# Patient Record
Sex: Female | Born: 1990 | Race: Black or African American | Hispanic: No | Marital: Single | Smoking: Never smoker
Health system: Southern US, Community
[De-identification: ages and names within clinical notes are randomized; demographics above are authoritative.]

## PROBLEM LIST (undated history)

## (undated) DIAGNOSIS — N760 Acute vaginitis: Secondary | ICD-10-CM

## (undated) DIAGNOSIS — E669 Obesity, unspecified: Secondary | ICD-10-CM

## (undated) HISTORY — DX: Obesity, unspecified: E66.9

## (undated) HISTORY — DX: Acute vaginitis: N76.0

## (undated) HISTORY — PX: NO PAST SURGERIES: SHX2092

---
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
## 2012-12-19 ENCOUNTER — Emergency Department (HOSPITAL_COMMUNITY): Payer: BC Managed Care – PPO

## 2012-12-19 ENCOUNTER — Encounter (HOSPITAL_COMMUNITY): Payer: Self-pay | Admitting: Emergency Medicine

## 2012-12-19 ENCOUNTER — Emergency Department (HOSPITAL_COMMUNITY)
Admission: EM | Admit: 2012-12-19 | Discharge: 2012-12-19 | Disposition: A | Discharge status: Home / Self Care | Payer: BC Managed Care – PPO | Attending: Emergency Medicine | Admitting: Emergency Medicine

## 2012-12-19 DIAGNOSIS — IMO0002 Reserved for concepts with insufficient information to code with codable children: Secondary | ICD-10-CM | POA: Insufficient documentation

## 2012-12-19 DIAGNOSIS — S43401A Unspecified sprain of right shoulder joint, initial encounter: Secondary | ICD-10-CM

## 2012-12-19 DIAGNOSIS — Y939 Activity, unspecified: Secondary | ICD-10-CM | POA: Insufficient documentation

## 2012-12-19 DIAGNOSIS — Y929 Unspecified place or not applicable: Secondary | ICD-10-CM | POA: Insufficient documentation

## 2012-12-19 DIAGNOSIS — X58XXXA Exposure to other specified factors, initial encounter: Secondary | ICD-10-CM | POA: Insufficient documentation

## 2012-12-19 MED ORDER — IBUPROFEN 600 MG PO TABS: 600.0000 mg | ORAL_TABLET | Freq: Four times a day (QID) | ORAL | Status: DC | PRN

## 2012-12-19 MED ORDER — CYCLOBENZAPRINE HCL 5 MG PO TABS: 5.0000 mg | ORAL_TABLET | Freq: Two times a day (BID) | ORAL | Status: DC | PRN

## 2012-12-19 NOTE — ED Notes (Signed)
Pt complains of sudden onset of right posterior shoulder pain.

## 2012-12-19 NOTE — ED Notes (Signed)
[id number]<WG> Expected date:<BR> Expected time:<BR> Means of arrival:<BR> Comments:<BR> ems-shoulder/back pain

## 2012-12-19 NOTE — ED Provider Notes (Signed)
History     CSN: [id number]  Arrival date & time 12/19/12  1010   First MD Initiated Contact with Patient 12/19/12 1112      Chief Complaint  Patient presents with  . Shoulder Pain    (Consider location/radiation/quality/duration/timing/severity/associated sxs/prior treatment) HPI  She presents to the emergency department with complaints of right shoulder pain that she woke up with this morning. She normally sleeps on her stomach. She does not remember any incidents where she injured it. She has no history of injury here in the past. She has no numbness tingling or weakness in the arm. No chest pain, shortness of breath, cough. nas vss  History reviewed. No pertinent past medical history.  History reviewed. No pertinent past surgical history.  No family history on file.  History  Substance Use Topics  . Smoking status: Never Smoker   . Smokeless tobacco: Not on file  . Alcohol Use: No    OB History   Grav Para Term Preterm Abortions TAB SAB Ect Mult Living                  Review of Systems  All other systems reviewed and are negative.    Allergies  Review of patient's allergies indicates no known allergies.  Home Medications   Current Outpatient Rx  Name  Route  Sig  Dispense  Refill  . cyclobenzaprine (FLEXERIL) 5 MG tablet   Oral   Take 1 tablet (5 mg total) by mouth 2 (two) times daily as needed for muscle spasms.   12 tablet   0   . ibuprofen (ADVIL,MOTRIN) 600 MG tablet   Oral   Take 1 tablet (600 mg total) by mouth every 6 (six) hours as needed for pain.   30 tablet   0     BP 125/63  Pulse 71  Temp(Src) 97.9 F (36.6 C) (Oral)  Resp 18  SpO2 99%  LMP 12/08/2012  Physical Exam  Nursing note and vitals reviewed. Constitutional: She appears well-developed and well-nourished. No distress.  HENT:  Head: Normocephalic and atraumatic.  Eyes: Pupils are equal, round, and reactive to light.  Neck: Normal range of motion. Neck supple.    Cardiovascular: Normal rate and regular rhythm.   Pulmonary/Chest: Effort normal.  Abdominal: Soft.  Musculoskeletal:       Thoracic back: She exhibits tenderness, pain and spasm. She exhibits normal range of motion, no bony tenderness, no swelling, no edema, no deformity, no laceration and normal pulse.       Back:  Neurological: She is alert.  Skin: Skin is warm and dry.    ED Course  Procedures (including critical care time)  Labs Reviewed - No data to display Dg Shoulder Right  12/19/2012  *RADIOLOGY REPORT*  Clinical Data: Right shoulder pain without injury  RIGHT SHOULDER - 2+ VIEW  Comparison: None.  Findings: No acute fracture or dislocation is noted.  No soft tissue abnormality is seen.  The underlying bony thorax is within normal limits.  IMPRESSION: No acute abnormality is noted.   Original Report Authenticated By: Alcide Clever, M.D.      1. Shoulder sprain, right, initial encounter       MDM  No abnormal findings on physical exam.  Flexeril and Ibuprofen Rx. Referral to Ortho.  Pt has been advised of the symptoms that warrant their return to the ED. Patient has voiced understanding and has agreed to follow-up with the PCP or specialist.  Dorthula Matas, PA-C 12/19/12 1224

## 2012-12-19 NOTE — ED Notes (Signed)
Patient transported to X-ray 

## 2012-12-19 NOTE — ED Notes (Signed)
Pt ambulated to restroom. 

## 2012-12-21 NOTE — ED Provider Notes (Signed)
Medical screening examination/treatment/procedure(s) were performed by non-physician practitioner and as supervising physician I was immediately available for consultation/collaboration.   Ilias Stcharles E Meagan Spease, MD 12/21/12 2148 

## 2015-01-16 ENCOUNTER — Emergency Department (HOSPITAL_COMMUNITY)
Admission: EM | Admit: 2015-01-16 | Discharge: 2015-01-16 | Disposition: A | Discharge status: Home / Self Care | Payer: Self-pay | Attending: Emergency Medicine | Admitting: Emergency Medicine

## 2015-01-16 ENCOUNTER — Encounter (HOSPITAL_COMMUNITY): Payer: Self-pay | Admitting: Emergency Medicine

## 2015-01-16 DIAGNOSIS — R000 Tachycardia, unspecified: Secondary | ICD-10-CM | POA: Insufficient documentation

## 2015-01-16 DIAGNOSIS — Z79899 Other long term (current) drug therapy: Secondary | ICD-10-CM | POA: Insufficient documentation

## 2015-01-16 DIAGNOSIS — Z791 Long term (current) use of non-steroidal anti-inflammatories (NSAID): Secondary | ICD-10-CM | POA: Insufficient documentation

## 2015-01-16 DIAGNOSIS — A5901 Trichomonal vulvovaginitis: Secondary | ICD-10-CM | POA: Insufficient documentation

## 2015-01-16 DIAGNOSIS — N390 Urinary tract infection, site not specified: Secondary | ICD-10-CM | POA: Insufficient documentation

## 2015-01-16 LAB — URINALYSIS, ROUTINE W REFLEX MICROSCOPIC
Glucose, UA: NEGATIVE mg/dL
KETONES UR: NEGATIVE mg/dL
NITRITE: NEGATIVE
Protein, ur: 30 mg/dL — AB
Specific Gravity, Urine: 1.038 — ABNORMAL HIGH (ref 1.005–1.030)
UROBILINOGEN UA: 1 mg/dL (ref 0.0–1.0)
pH: 5.5 (ref 5.0–8.0)

## 2015-01-16 LAB — URINE MICROSCOPIC-ADD ON

## 2015-01-16 LAB — WET PREP, GENITAL
CLUE CELLS WET PREP: NONE SEEN
Yeast Wet Prep HPF POC: NONE SEEN

## 2015-01-16 MED ORDER — METRONIDAZOLE 500 MG PO TABS: 500.0000 mg | ORAL_TABLET | Freq: Two times a day (BID) | ORAL | Status: DC

## 2015-01-16 MED ORDER — CEFTRIAXONE SODIUM 250 MG IJ SOLR
250.0000 mg | Freq: Once | INTRAMUSCULAR | Status: AC
  Administered 2015-01-16: 250 mg via INTRAMUSCULAR
  Filled 2015-01-16: qty 250

## 2015-01-16 MED ORDER — AZITHROMYCIN 250 MG PO TABS
1000.0000 mg | ORAL_TABLET | Freq: Once | ORAL | Status: AC
  Administered 2015-01-16: 1000 mg via ORAL
  Filled 2015-01-16: qty 4

## 2015-01-16 MED ORDER — CEPHALEXIN 500 MG PO CAPS: 500.0000 mg | ORAL_CAPSULE | Freq: Four times a day (QID) | ORAL | Status: DC

## 2015-01-16 MED ORDER — LIDOCAINE HCL (PF) 1 % IJ SOLN
INTRAMUSCULAR | Status: AC
Start: 2015-01-16 — End: 2015-01-16
  Administered 2015-01-16: 5 mL
  Filled 2015-01-16: qty 5

## 2015-01-16 NOTE — ED Notes (Signed)
Pt is unable to give urine sample at this time. 

## 2015-01-16 NOTE — ED Provider Notes (Signed)
CSN: [id number]     Arrival date & time 01/16/15  1809 History   First MD Initiated Contact with Patient 01/16/15 1855     Chief Complaint  Patient presents with  . Vaginal pain      (Consider location/radiation/quality/duration/timing/severity/associated sxs/prior Treatment) HPI Comments: 24 year old morbidly obese female complaining of itching and burning to her vagina after applying Darene Lamerair 2 days ago to her genital area. States she feels burning both on the inner and outer aspect of her vagina, states it feels very irritated, and when she wiped this morning, she noted small streaks of blood on the toilet paper which she believes may be from irritation. Reports she has not looked at the area as she cannot see her vagina over her stomach. Denies vaginal discharge, increased urinary frequency or urgency. Reports dysuria. States she is sexually active, and last had intercourse about one month ago.  The history is provided by the patient.    History reviewed. No pertinent past medical history. History reviewed. No pertinent past surgical history. History reviewed. No pertinent family history. History  Substance Use Topics  . Smoking status: Never Smoker   . Smokeless tobacco: Not on file  . Alcohol Use: No   OB History    No data available     Review of Systems  Genitourinary: Positive for dysuria and vaginal pain.  All other systems reviewed and are negative.     Allergies  Mushroom extract complex and Fish-derived products  Home Medications   Prior to Admission medications   Medication Sig Start Date End Date Taking? Authorizing Provider  acetaminophen (TYLENOL) 500 MG tablet Take 500 mg by mouth every 6 (six) hours as needed for moderate pain (pain).   Yes Historical Provider, MD  cephALEXin (KEFLEX) 500 MG capsule Take 1 capsule (500 mg total) by mouth 4 (four) times daily. 01/16/15   Kathrynn Speedobyn M Evonne Rinks, PA-C  cyclobenzaprine (FLEXERIL) 5 MG tablet Take 1 tablet (5 mg total) by  mouth 2 (two) times daily as needed for muscle spasms. Patient not taking: Reported on 01/16/2015 12/19/12   Marlon Peliffany Greene, PA-C  ibuprofen (ADVIL,MOTRIN) 600 MG tablet Take 1 tablet (600 mg total) by mouth every 6 (six) hours as needed for pain. Patient not taking: Reported on 01/16/2015 12/19/12   Marlon Peliffany Greene, PA-C  metroNIDAZOLE (FLAGYL) 500 MG tablet Take 1 tablet (500 mg total) by mouth 2 (two) times daily. One po bid x 7 days 01/16/15   Nada Boozerobyn M Maisa Bedingfield, PA-C   BP 137/69 mmHg  Pulse 104  Temp(Src) 98.1 F (36.7 C) (Oral)  Resp 20  SpO2 96% Physical Exam  Constitutional: She is oriented to person, place, and time. She appears well-developed and well-nourished. No distress.  Morbidly obese.  HENT:  Head: Normocephalic and atraumatic.  Mouth/Throat: Oropharynx is clear and moist.  Eyes: Conjunctivae and EOM are normal.  Neck: Normal range of motion. Neck supple.  Cardiovascular: Regular rhythm and normal heart sounds.   Mildly tachycardic.  Pulmonary/Chest: Effort normal and breath sounds normal. No respiratory distress.  Genitourinary: There is no rash on the right labia. There is no rash on the left labia. Cervix exhibits no motion tenderness, no discharge and no friability. No erythema, tenderness or bleeding in the vagina. No foreign body around the vagina. Vaginal discharge (white/clear, malodorous) found.  Exam limited by pt's body habitus.  Musculoskeletal: Normal range of motion. She exhibits no edema.  Neurological: She is alert and oriented to person, place, and time. No sensory deficit.  Skin: Skin is warm and dry.  Psychiatric: She has a normal mood and affect. Her behavior is normal.  Nursing note and vitals reviewed.   ED Course  Procedures (including critical care time) Labs Review Labs Reviewed  WET PREP, GENITAL - Abnormal; Notable for the following:    Trich, Wet Prep FEW (*)    WBC, Wet Prep HPF POC FEW (*)    All other components within normal limits  URINALYSIS,  ROUTINE W REFLEX MICROSCOPIC - Abnormal; Notable for the following:    Color, Urine AMBER (*)    APPearance CLOUDY (*)    Specific Gravity, Urine 1.038 (*)    Hgb urine dipstick SMALL (*)    Bilirubin Urine SMALL (*)    Protein, ur 30 (*)    Leukocytes, UA MODERATE (*)    All other components within normal limits  URINE MICROSCOPIC-ADD ON - Abnormal; Notable for the following:    Squamous Epithelial / LPF MANY (*)    All other components within normal limits  GC/CHLAMYDIA PROBE AMP ([place])    Imaging Review No results found.   EKG Interpretation None      MDM   Final diagnoses:  Trichomonas vaginitis  UTI (lower urinary tract infection)   Nontoxic appearing, NAD. Afebrile. Mild tachycardia.vitals otherwise stable. Abdomen soft and non tender. Wet prep significant for Trichomonas. UA contaminated, however still has 21-50 white blood cells, moderate leukocytes. Given dysuria, will treat for UTI. There is no rash around her vaginal area. Infection care/precautions discussed. IM Rocephin given. PO azithromycin given. No CMT or adnexal tenderness. Discharge home with Keflex and Flagyl. Stable for discharge. Return precautions given. Patient states understanding of treatment care plan and is agreeable.  Kathrynn SpeedRobyn M Maan Zarcone, PA-C 01/16/15 2131  Mancel BaleElliott Wentz, MD 01/17/15 [phone]

## 2015-01-16 NOTE — ED Notes (Signed)
Med Hold complete discharge at 2202

## 2015-01-16 NOTE — Discharge Instructions (Signed)
Take Flagyl twice daily for 1 week. Take Keflex as prescribed for 1 week. You were treated today for sexually transmitted diseases. You are obligated to inform your partner for treatment.  Trichomoniasis Trichomoniasis is an infection caused by an organism called Trichomonas. The infection can affect both women and men. In women, the outer female genitalia and the vagina are affected. In men, the penis is mainly affected, but the prostate and other reproductive organs can also be involved. Trichomoniasis is a sexually transmitted infection (STI) and is most often passed to another person through sexual contact.  RISK FACTORS  Having unprotected sexual intercourse.  Having sexual intercourse with an infected partner. SIGNS AND SYMPTOMS  Symptoms of trichomoniasis in women include:  Abnormal gray-green frothy vaginal discharge.  Itching and irritation of the vagina.  Itching and irritation of the area outside the vagina. Symptoms of trichomoniasis in men include:   Penile discharge with or without pain.  Pain during urination. This results from inflammation of the urethra. DIAGNOSIS  Trichomoniasis may be found during a Pap test or physical exam. Your health care provider may use one of the following methods to help diagnose this infection:  Examining vaginal discharge under a microscope. For men, urethral discharge would be examined.  Testing the pH of the vagina with a test tape.  Using a vaginal swab test that checks for the Trichomonas organism. A test is available that provides results within a few minutes.  Doing a culture test for the organism. This is not usually needed. TREATMENT   You may be given medicine to fight the infection. Women should inform their health care provider if they could be or are pregnant. Some medicines used to treat the infection should not be taken during pregnancy.  Your health care provider may recommend over-the-counter medicines or creams to  decrease itching or irritation.  Your sexual partner will need to be treated if infected. HOME CARE INSTRUCTIONS   Take medicines only as directed by your health care provider.  Take over-the-counter medicine for itching or irritation as directed by your health care provider.  Do not have sexual intercourse while you have the infection.  Women should not douche or wear tampons while they have the infection.  Discuss your infection with your partner. Your partner may have gotten the infection from you, or you may have gotten it from your partner.  Have your sex partner get examined and treated if necessary.  Practice safe, informed, and protected sex.  See your health care provider for other STI testing. SEEK MEDICAL CARE IF:   You still have symptoms after you finish your medicine.  You develop abdominal pain.  You have pain when you urinate.  You have bleeding after sexual intercourse.  You develop a rash.  Your medicine makes you sick or makes you throw up (vomit). MAKE SURE YOU:  Understand these instructions.  Will watch your condition.  Will get help right away if you are not doing well or get worse. Document Released: 02/24/2001 Document Revised: 01/15/2014 Document Reviewed: 06/12/2013 [facility] Patient Information 2015 [place], [place]. This information is not intended to replace advice given to you by your health care provider. Make sure you discuss any questions you have with your health care provider.  Urinary Tract Infection Urinary tract infections (UTIs) can develop anywhere along your urinary tract. Your urinary tract is your body's drainage system for removing wastes and extra water. Your urinary tract includes two kidneys, two ureters, a bladder, and a urethra.  Your kidneys are a pair of bean-shaped organs. Each kidney is about the size of your fist. They are located below your ribs, one on each side of your spine. CAUSES Infections are caused by microbes,  which are microscopic organisms, including fungi, viruses, and bacteria. These organisms are so small that they can only be seen through a microscope. Bacteria are the microbes that most commonly cause UTIs. SYMPTOMS  Symptoms of UTIs may vary by age and gender of the patient and by the location of the infection. Symptoms in young women typically include a frequent and intense urge to urinate and a painful, burning feeling in the bladder or urethra during urination. Older women and men are more likely to be tired, shaky, and weak and have muscle aches and abdominal pain. A fever may mean the infection is in your kidneys. Other symptoms of a kidney infection include pain in your back or sides below the ribs, nausea, and vomiting. DIAGNOSIS To diagnose a UTI, your caregiver will ask you about your symptoms. Your caregiver also will ask to provide a urine sample. The urine sample will be tested for bacteria and white blood cells. White blood cells are made by your body to help fight infection. TREATMENT  Typically, UTIs can be treated with medication. Because most UTIs are caused by a bacterial infection, they usually can be treated with the use of antibiotics. The choice of antibiotic and length of treatment depend on your symptoms and the type of bacteria causing your infection. HOME CARE INSTRUCTIONS  If you were prescribed antibiotics, take them exactly as your caregiver instructs you. Finish the medication even if you feel better after you have only taken some of the medication.  Drink enough water and fluids to keep your urine clear or pale yellow.  Avoid caffeine, tea, and carbonated beverages. They tend to irritate your bladder.  Empty your bladder often. Avoid holding urine for long periods of time.  Empty your bladder before and after sexual intercourse.  After a bowel movement, women should cleanse from front to back. Use each tissue only once. SEEK MEDICAL CARE IF:   You have back  pain.  You develop a fever.  Your symptoms do not begin to resolve within 3 days. SEEK IMMEDIATE MEDICAL CARE IF:   You have severe back pain or lower abdominal pain.  You develop chills.  You have nausea or vomiting.  You have continued burning or discomfort with urination. MAKE SURE YOU:   Understand these instructions.  Will watch your condition.  Will get help right away if you are not doing well or get worse. Document Released: 06/10/2005 Document Revised: 03/01/2012 Document Reviewed: 10/09/2011 [facility] Patient Information 2015 [place], [place]. This information is not intended to replace advice given to you by your health care provider. Make sure you discuss any questions you have with your health care provider.  Sexually Transmitted Disease A sexually transmitted disease (STD) is a disease or infection that may be passed (transmitted) from person to person, usually during sexual activity. This may happen by way of saliva, semen, blood, vaginal mucus, or urine. Common STDs include:   Gonorrhea.   Chlamydia.   Syphilis.   HIV and AIDS.   Genital herpes.   Hepatitis B and C.   Trichomonas.   Human papillomavirus (HPV).   Pubic lice.   Scabies.  Mites.  Bacterial vaginosis. WHAT ARE CAUSES OF STDs? An STD may be caused by bacteria, a virus, or parasites. STDs are often transmitted  during sexual activity if one person is infected. However, they may also be transmitted through nonsexual means. STDs may be transmitted after:   Sexual intercourse with an infected person.   Sharing sex toys with an infected person.   Sharing needles with an infected person or using unclean piercing or tattoo needles.  Having intimate contact with the genitals, mouth, or rectal areas of an infected person.   Exposure to infected fluids during birth. WHAT ARE THE SIGNS AND SYMPTOMS OF STDs? Different STDs have different symptoms. Some people may not have any  symptoms. If symptoms are present, they may include:   Painful or bloody urination.   Pain in the pelvis, abdomen, vagina, anus, throat, or eyes.   A skin rash, itching, or irritation.  Growths, ulcerations, blisters, or sores in the genital and anal areas.  Abnormal vaginal discharge with or without bad odor.   Penile discharge in men.   Fever.   Pain or bleeding during sexual intercourse.   Swollen glands in the groin area.   Yellow skin and eyes (jaundice). This is seen with hepatitis.   Swollen testicles.  Infertility.  Sores and blisters in the mouth. HOW ARE STDs DIAGNOSED? To make a diagnosis, your health care provider may:   Take a medical history.   Perform a physical exam.   Take a sample of any discharge to examine.  Swab the throat, cervix, opening to the penis, rectum, or vagina for testing.  Test a sample of your first morning urine.   Perform blood tests.   Perform a Pap test, if this applies.   Perform a colposcopy.   Perform a laparoscopy.  HOW ARE STDs TREATED? Treatment depends on the STD. Some STDs may be treated but not cured.   Chlamydia, gonorrhea, trichomonas, and syphilis can be cured with antibiotic medicine.   Genital herpes, hepatitis, and HIV can be treated, but not cured, with prescribed medicines. The medicines lessen symptoms.   Genital warts from HPV can be treated with medicine or by freezing, burning (electrocautery), or surgery. Warts may come back.   HPV cannot be cured with medicine or surgery. However, abnormal areas may be removed from the cervix, vagina, or vulva.   If your diagnosis is confirmed, your recent sexual partners need treatment. This is true even if they are symptom-free or have a negative culture or evaluation. They should not have sex until their health care providers say it is okay. HOW CAN I REDUCE MY RISK OF GETTING AN STD? Take these steps to reduce your risk of getting an  STD:  Use latex condoms, dental dams, and water-soluble lubricants during sexual activity. Do not use petroleum jelly or oils.  Avoid having multiple sex partners.  Do not have sex with someone who has other sex partners.  Do not have sex with anyone you do not know or who is at high risk for an STD.  Avoid risky sex practices that can break your skin.  Do not have sex if you have open sores on your mouth or skin.  Avoid drinking too much alcohol or taking illegal drugs. Alcohol and drugs can affect your judgment and put you in a vulnerable position.  Avoid engaging in oral and anal sex acts.  Get vaccinated for HPV and hepatitis. If you have not received these vaccines in the past, talk to your health care provider about whether one or both might be right for you.   If you are at risk of being  infected with HIV, it is recommended that you take a prescription medicine daily to prevent HIV infection. This is called pre-exposure prophylaxis (PrEP). You are considered at risk if:  You are a man who has sex with other men (MSM).  You are a heterosexual man or woman and are sexually active with more than one partner.  You take drugs by injection.  You are sexually active with a partner who has HIV.  Talk with your health care provider about whether you are at high risk of being infected with HIV. If you choose to begin PrEP, you should first be tested for HIV. You should then be tested every 3 months for as long as you are taking PrEP.  WHAT SHOULD I DO IF I THINK I HAVE AN STD?  See your health care provider.   Tell your sexual partner(s). They should be tested and treated for any STDs.  Do not have sex until your health care provider says it is okay. WHEN SHOULD I GET IMMEDIATE MEDICAL CARE? Contact your health care provider right away if:   You have severe abdominal pain.  You are a man and notice swelling or pain in your testicles.  You are a woman and notice swelling  or pain in your vagina. Document Released: 11/21/2002 Document Revised: 09/05/2013 Document Reviewed: 03/21/2013 [facility] Patient Information 2015 [place]. This information is not intended to replace advice given to you by your health care provider. Make sure you discuss any questions you have with your health care provider.

## 2015-01-16 NOTE — ED Notes (Signed)
Pt states she used Darene Lamerair two days ago at her genital area. Since then, she's been having a lot of burning on the inner and outer skin of the vagina. States she wiped this morning and the toilet paper may have been streaked with blood.

## 2015-01-17 LAB — GC/CHLAMYDIA PROBE AMP (~~LOC~~) NOT AT ARMC
CHLAMYDIA, DNA PROBE: NEGATIVE
Neisseria Gonorrhea: NEGATIVE

## 2016-07-19 ENCOUNTER — Encounter (HOSPITAL_COMMUNITY): Payer: Self-pay | Admitting: *Deleted

## 2016-07-19 ENCOUNTER — Inpatient Hospital Stay (HOSPITAL_COMMUNITY)
Admission: AD | Admit: 2016-07-19 | Discharge: 2016-07-19 | Disposition: A | Discharge status: Home / Self Care | Payer: BLUE CROSS/BLUE SHIELD | Source: Ambulatory Visit | Attending: Obstetrics and Gynecology | Admitting: Obstetrics and Gynecology

## 2016-07-19 DIAGNOSIS — N921 Excessive and frequent menstruation with irregular cycle: Secondary | ICD-10-CM | POA: Diagnosis not present

## 2016-07-19 DIAGNOSIS — Z1389 Encounter for screening for other disorder: Secondary | ICD-10-CM | POA: Insufficient documentation

## 2016-07-19 DIAGNOSIS — B9689 Other specified bacterial agents as the cause of diseases classified elsewhere: Secondary | ICD-10-CM

## 2016-07-19 DIAGNOSIS — N760 Acute vaginitis: Secondary | ICD-10-CM

## 2016-07-19 LAB — URINALYSIS, ROUTINE W REFLEX MICROSCOPIC
BILIRUBIN URINE: NEGATIVE
Glucose, UA: NEGATIVE mg/dL
KETONES UR: NEGATIVE mg/dL
Leukocytes, UA: NEGATIVE
NITRITE: NEGATIVE
PROTEIN: NEGATIVE mg/dL
SPECIFIC GRAVITY, URINE: 1.02 (ref 1.005–1.030)
pH: 6.5 (ref 5.0–8.0)

## 2016-07-19 LAB — URINE MICROSCOPIC-ADD ON

## 2016-07-19 LAB — WET PREP, GENITAL
Sperm: NONE SEEN
Trich, Wet Prep: NONE SEEN
Yeast Wet Prep HPF POC: NONE SEEN

## 2016-07-19 LAB — POCT PREGNANCY, URINE: PREG TEST UR: NEGATIVE

## 2016-07-19 MED ORDER — METRONIDAZOLE 500 MG PO TABS: 500.0000 mg | ORAL_TABLET | Freq: Two times a day (BID) | ORAL | 0 refills | Status: AC

## 2016-07-19 MED ORDER — NORGESTIMATE-ETH ESTRADIOL 0.25-35 MG-MCG PO TABS: 1.0000 | ORAL_TABLET | Freq: Every day | ORAL | 0 refills | Status: DC

## 2016-07-19 NOTE — MAU Note (Signed)
Pt states she started Depo in August and has been bleeding about every 2 weeks and bleeding is heavy. Also reports sharp pain in lower abd that started today. Reports she just stopped bleeding about one week ago and started again today.

## 2016-07-19 NOTE — Discharge Instructions (Signed)
Abnormal Uterine Bleeding °Abnormal uterine bleeding means bleeding from the vagina that is not your normal menstrual period. This can be: °· Bleeding or spotting between periods. °· Bleeding after sex (sexual intercourse). °· Bleeding that is heavier or more than normal. °· Periods that last longer than usual. °· Bleeding after menopause. °There are many problems that may cause this. Treatment will depend on the cause of the bleeding. Any kind of bleeding that is not normal should be reviewed by your doctor.  °HOME CARE °Watch your condition for any changes. These actions may lessen any discomfort you are having: °· Do not use tampons or douches as told by your doctor. °· Change your pads often. °You should get regular pelvic exams and Pap tests. Keep all appointments for tests as told by your doctor. °GET HELP IF: °· You are bleeding for more than 1 week. °· You feel dizzy at times. °GET HELP RIGHT AWAY IF:  °· You pass out. °· You have to change pads every 15 to 30 minutes. °· You have belly pain. °· You have a fever. °· You become sweaty or weak. °· You are passing large blood clots from the vagina. °· You feel sick to your stomach (nauseous) and throw up (vomit). °MAKE SURE YOU: °· Understand these instructions. °· Will watch your condition. °· Will get help right away if you are not doing well or get worse. °  °This information is not intended to replace advice given to you by your health care provider. Make sure you discuss any questions you have with your health care provider. °  °Document Released: 06/28/2009 Document Revised: 09/05/2013 Document Reviewed: 03/30/2013 °Elsevier Interactive Patient Education ©2016 [employer] Inc[employer] ° °Bacterial Vaginosis °Bacterial vaginosis is an infection of the vagina. It happens when too many germs (bacteria) grow in the vagina. Having this infection puts you at risk for getting other infections from sex. Treating this infection can help lower your risk for other infections,  such as:  °· Chlamydia. °· Gonorrhea. °· HIV. °· Herpes. °HOME CARE °· Take your medicine as told by your doctor. °· Finish your medicine even if you start to feel better. °· Tell your sex partner that you have an infection. They should see their doctor for treatment. °· During treatment: °¨ Avoid sex or use condoms correctly. °¨ Do not douche. °¨ Do not drink alcohol unless your doctor tells you it is ok. °¨ Do not breastfeed unless your doctor tells you it is ok. °GET HELP IF: °· You are not getting better after 3 days of treatment. °· You have more grey fluid (discharge) coming from your vagina than before. °· You have more pain than before. °· You have a fever. °MAKE SURE YOU:  °· Understand these instructions. °· Will watch your condition. °· Will get help right away if you are not doing well or get worse. °  °This information is not intended to replace advice given to you by your health care provider. Make sure you discuss any questions you have with your health care provider. °  °Document Released: 06/09/2008 Document Revised: 09/21/2014 Document Reviewed: 04/12/2013 °Elsevier Interactive Patient Education ©2016 Elsevier Inc. ° °

## 2016-07-20 LAB — GC/CHLAMYDIA PROBE AMP (~~LOC~~) NOT AT ARMC
CHLAMYDIA, DNA PROBE: NEGATIVE
Neisseria Gonorrhea: NEGATIVE

## 2016-07-26 ENCOUNTER — Encounter (HOSPITAL_COMMUNITY): Payer: Self-pay

## 2016-07-26 ENCOUNTER — Emergency Department (HOSPITAL_COMMUNITY)
Admission: EM | Admit: 2016-07-26 | Discharge: 2016-07-26 | Disposition: A | Discharge status: Home / Self Care | Payer: BLUE CROSS/BLUE SHIELD | Attending: Emergency Medicine | Admitting: Emergency Medicine

## 2016-07-26 DIAGNOSIS — R1084 Generalized abdominal pain: Secondary | ICD-10-CM | POA: Diagnosis present

## 2016-07-26 DIAGNOSIS — R1013 Epigastric pain: Secondary | ICD-10-CM | POA: Diagnosis not present

## 2016-07-26 LAB — COMPREHENSIVE METABOLIC PANEL
ALT: 37 U/L (ref 14–54)
AST: 32 U/L (ref 15–41)
Albumin: 3.3 g/dL — ABNORMAL LOW (ref 3.5–5.0)
Alkaline Phosphatase: 76 U/L (ref 38–126)
Anion gap: 6 (ref 5–15)
BUN: 8 mg/dL (ref 6–20)
CHLORIDE: 106 mmol/L (ref 101–111)
CO2: 25 mmol/L (ref 22–32)
CREATININE: 0.86 mg/dL (ref 0.44–1.00)
Calcium: 8.7 mg/dL — ABNORMAL LOW (ref 8.9–10.3)
GFR calc Af Amer: >60 mL/min (ref >60)
Glucose, Bld: 134 mg/dL — ABNORMAL HIGH (ref 65–99)
Potassium: 3.3 mmol/L — ABNORMAL LOW (ref 3.5–5.1)
Sodium: 137 mmol/L (ref 135–145)
Total Bilirubin: 0.5 mg/dL (ref 0.3–1.2)
Total Protein: 7.7 g/dL (ref 6.5–8.1)

## 2016-07-26 LAB — CBC
HCT: 38.1 % (ref 36.0–46.0)
Hemoglobin: 12.4 g/dL (ref 12.0–15.0)
MCH: 26.6 pg (ref 26.0–34.0)
MCHC: 32.5 g/dL (ref 30.0–36.0)
MCV: 81.8 fL (ref 78.0–100.0)
PLATELETS: 221 10*3/uL (ref 150–400)
RBC: 4.66 MIL/uL (ref 3.87–5.11)
RDW: 14.2 % (ref 11.5–15.5)
WBC: 6.1 10*3/uL (ref 4.0–10.5)

## 2016-07-26 LAB — URINALYSIS, ROUTINE W REFLEX MICROSCOPIC
GLUCOSE, UA: NEGATIVE mg/dL
HGB URINE DIPSTICK: NEGATIVE
KETONES UR: 15 mg/dL — AB
Leukocytes, UA: NEGATIVE
Nitrite: NEGATIVE
PROTEIN: NEGATIVE mg/dL
Specific Gravity, Urine: 1.027 (ref 1.005–1.030)
pH: 6.5 (ref 5.0–8.0)

## 2016-07-26 LAB — LIPASE, BLOOD: LIPASE: 16 U/L (ref 11–51)

## 2016-07-26 MED ORDER — ONDANSETRON HCL 4 MG/2ML IJ SOLN
4.0000 mg | Freq: Once | INTRAMUSCULAR | Status: AC
  Administered 2016-07-26: 4 mg via INTRAVENOUS
  Filled 2016-07-26: qty 2

## 2016-07-26 MED ORDER — PANTOPRAZOLE SODIUM 20 MG PO TBEC: 20.0000 mg | DELAYED_RELEASE_TABLET | Freq: Every day | ORAL | 0 refills | Status: DC

## 2016-07-26 MED ORDER — ONDANSETRON HCL 4 MG PO TABS: 4.0000 mg | ORAL_TABLET | Freq: Four times a day (QID) | ORAL | 0 refills | Status: DC

## 2016-07-26 MED ORDER — SODIUM CHLORIDE 0.9 % IV BOLUS (SEPSIS)
1000.0000 mL | Freq: Once | INTRAVENOUS | Status: AC
  Administered 2016-07-26: 1000 mL via INTRAVENOUS

## 2016-07-26 MED ORDER — FAMOTIDINE IN NACL 20-0.9 MG/50ML-% IV SOLN
20.0000 mg | Freq: Once | INTRAVENOUS | Status: AC
  Administered 2016-07-26: 20 mg via INTRAVENOUS
  Filled 2016-07-26: qty 50

## 2016-07-26 MED ORDER — LORAZEPAM 2 MG/ML IJ SOLN
1.0000 mg | Freq: Once | INTRAMUSCULAR | Status: AC
  Administered 2016-07-26: 1 mg via INTRAVENOUS
  Filled 2016-07-26: qty 1

## 2016-07-26 MED ORDER — HYDROMORPHONE HCL 1 MG/ML IJ SOLN
1.0000 mg | Freq: Once | INTRAMUSCULAR | Status: AC
Start: 2016-07-26 — End: 2016-07-26
  Administered 2016-07-26: 1 mg via INTRAVENOUS
  Filled 2016-07-26: qty 1

## 2016-07-26 MED ORDER — ONDANSETRON HCL 4 MG/2ML IJ SOLN: 4.0000 mg | Freq: Once | INTRAMUSCULAR | Status: DC

## 2016-07-26 MED ORDER — DICYCLOMINE HCL 10 MG/ML IM SOLN
20.0000 mg | Freq: Once | INTRAMUSCULAR | Status: AC
  Administered 2016-07-26: 20 mg via INTRAMUSCULAR
  Filled 2016-07-26: qty 2

## 2016-07-26 MED ORDER — HYDROMORPHONE HCL 1 MG/ML IJ SOLN
1.0000 mg | Freq: Once | INTRAMUSCULAR | Status: AC
  Administered 2016-07-26: 1 mg via INTRAVENOUS
  Filled 2016-07-26: qty 1

## 2016-07-26 MED ORDER — DICYCLOMINE HCL 20 MG PO TABS: ORAL_TABLET | ORAL | 0 refills | Status: DC

## 2016-07-26 MED ORDER — KETOROLAC TROMETHAMINE 30 MG/ML IJ SOLN
30.0000 mg | Freq: Once | INTRAMUSCULAR | Status: AC
  Administered 2016-07-26: 30 mg via INTRAVENOUS
  Filled 2016-07-26: qty 1

## 2016-07-26 MED ORDER — ONDANSETRON HCL 4 MG/2ML IJ SOLN
4.0000 mg | Freq: Once | INTRAMUSCULAR | Status: AC | PRN
  Administered 2016-07-26: 4 mg via INTRAVENOUS
  Filled 2016-07-26: qty 2

## 2016-07-26 NOTE — ED Notes (Signed)
Emesis noted, patient vomited in trash can, appears to be blood notified MD

## 2016-07-26 NOTE — ED Notes (Signed)
Repots c/o nausea after drinking water, no emesis noted.

## 2016-07-26 NOTE — ED Triage Notes (Signed)
PT C/O MID-ABDOMINAL PAIN WITH N/V X3-4 DAYS. PT STS SHE DID HAVE DIARRHEA 2 DAYS AGO. PT DENIES FEVER.

## 2016-07-26 NOTE — Discharge Instructions (Signed)
Follow-up with our GI not improving

## 2016-07-26 NOTE — ED Provider Notes (Addendum)
[facility]-EMERGENCY DEPT Provider Note   CSN: [id number] Arrival date & time: 07/26/16  1557     History   Chief Complaint Chief Complaint  Patient presents with  . Abdominal Pain    HPI Renee Stanley is a 25 y.o. female.  Patient complains of nausea vomiting and some diarrhea. She's been taking Flagyl for the last week for vaginitis. She is also having abdominal cramping   The history is provided by the patient. No language interpreter was used.  Abdominal Pain   This is a new problem. The current episode started more than 2 days ago. The problem occurs constantly. The problem has not changed since onset.The pain is associated with an unknown factor. The pain is located in the generalized abdominal region. The quality of the pain is aching. The pain is at a severity of 5/10. The pain is moderate. Pertinent negatives include anorexia, diarrhea, frequency, hematuria and headaches.    Past Medical History:  Diagnosis Date  . Medical history non-contributory     There are no active problems to display for this patient.   Past Surgical History:  Procedure Laterality Date  . NO PAST SURGERIES      OB History    Gravida Para Term Preterm AB Living   0 0 0 0 0 0   SAB TAB Ectopic Multiple Live Births   0 0 0 0 0       Home Medications    Prior to Admission medications   Medication Sig Start Date End Date Taking? Authorizing Provider  metroNIDAZOLE (FLAGYL) 500 MG tablet Take 1 tablet (500 mg total) by mouth 2 (two) times daily. 07/19/16 07/26/16 Yes Elizabeth [place] Top MountainWoodland Mumaw, DO  norgestimate-ethinyl estradiol (ORTHO-CYCLEN,SPRINTEC,PREVIFEM) 0.25-35 MG-MCG tablet Take 1 tablet by mouth daily. Start at the beginning of your next Depo Provera shot to decrease menstruation. 07/19/16  Yes [facility] Woodland Mumaw, DO  acetaminophen (TYLENOL) 500 MG tablet Take 500 mg by mouth every 6 (six) hours as needed for moderate pain (pain).    Historical Provider, MD  cephALEXin (KEFLEX)  500 MG capsule Take 1 capsule (500 mg total) by mouth 4 (four) times daily. Patient not taking: Reported on 07/26/2016 01/16/15   Kathrynn Speedobyn M Hess, PA-C  dicyclomine (BENTYL) 20 MG tablet Take one every 6 hours as needed for abd cramps 07/26/16   Bethann BerkshireJoseph Taraoluwa Thakur, MD  ibuprofen (ADVIL,MOTRIN) 600 MG tablet Take 1 tablet (600 mg total) by mouth every 6 (six) hours as needed for pain. Patient not taking: Reported on 07/26/2016 12/19/12   Marlon Peliffany Greene, PA-C  metroNIDAZOLE (FLAGYL) 500 MG tablet Take 1 tablet (500 mg total) by mouth 2 (two) times daily. One po bid x 7 days Patient not taking: Reported on 07/26/2016 01/16/15   Nada Boozerobyn M Hess, PA-C  ondansetron (ZOFRAN) 4 MG tablet Take 1 tablet (4 mg total) by mouth every 6 (six) hours. 07/26/16   Bethann BerkshireJoseph Armand Preast, MD    Family History History reviewed. No pertinent family history.  Social History Social History  Substance Use Topics  . Smoking status: Never Smoker  . Smokeless tobacco: Never Used  . Alcohol use No     Allergies   Mushroom extract complex and Fish-derived products   Review of Systems Review of Systems  Constitutional: Negative for appetite change and fatigue.  HENT: Negative for congestion, ear discharge and sinus pressure.   Eyes: Negative for discharge.  Respiratory: Negative for cough.   Cardiovascular: Negative for chest pain.  Gastrointestinal: Positive for abdominal pain. Negative  for anorexia and diarrhea.  Genitourinary: Negative for frequency and hematuria.  Musculoskeletal: Negative for back pain.  Skin: Negative for rash.  Neurological: Negative for seizures and headaches.  Psychiatric/Behavioral: Negative for hallucinations.     Physical Exam Updated Vital Signs BP 120/61 (BP Location: Left Arm)   Pulse 80   Temp 98.2 F (36.8 C) (Oral)   Resp 15   Ht 5\' 7"  (1.702 m)   Wt (!) 378 lb (171.5 kg)   LMP 07/19/2016   SpO2 99%   BMI 59.20 kg/m   Physical Exam  Constitutional: She is oriented to person,  place, and time. She appears well-developed.  HENT:  Head: Normocephalic.  Eyes: Conjunctivae and EOM are normal. No scleral icterus.  Neck: Neck supple. No thyromegaly present.  Cardiovascular: Normal rate and regular rhythm.  Exam reveals no gallop and no friction rub.   No murmur heard. Pulmonary/Chest: No stridor. She has no wheezes. She has no rales. She exhibits no tenderness.  Abdominal: She exhibits no distension. There is tenderness. There is no rebound.  Musculoskeletal: Normal range of motion. She exhibits no edema.  Lymphadenopathy:    She has no cervical adenopathy.  Neurological: She is oriented to person, place, and time. She exhibits normal muscle tone. Coordination normal.  Skin: No rash noted. No erythema.  Psychiatric: She has a normal mood and affect. Her behavior is normal.     ED Treatments / Results  Labs (all labs ordered are listed, but only abnormal results are displayed) Labs Reviewed  COMPREHENSIVE METABOLIC PANEL - Abnormal; Notable for the following:       Result Value   Potassium 3.3 (*)    Glucose, Bld 134 (*)    Calcium 8.7 (*)    Albumin 3.3 (*)    All other components within normal limits  URINALYSIS, ROUTINE W REFLEX MICROSCOPIC (NOT [facility]) - Abnormal; Notable for the following:    Color, Urine AMBER (*)    Bilirubin Urine SMALL (*)    Ketones, ur 15 (*)    All other components within normal limits  LIPASE, BLOOD  CBC  HCG, QUANTITATIVE, PREGNANCY    EKG  EKG Interpretation None       Radiology No results found.  Procedures Procedures (including critical care time)  Medications Ordered in ED Medications  ondansetron (ZOFRAN) injection 4 mg (4 mg Intravenous Given 07/26/16 1644)  famotidine (PEPCID) IVPB 20 mg premix (0 mg Intravenous Stopped 07/26/16 1741)  HYDROmorphone (DILAUDID) injection 1 mg (1 mg Intravenous Given 07/26/16 1644)  sodium chloride 0.9 % bolus 1,000 mL (0 mLs Intravenous Stopped 07/26/16 1745)    HYDROmorphone (DILAUDID) injection 1 mg (1 mg Intravenous Given 07/26/16 1741)  ketorolac (TORADOL) 30 MG/ML injection 30 mg (30 mg Intravenous Given 07/26/16 1844)  LORazepam (ATIVAN) injection 1 mg (1 mg Intravenous Given 07/26/16 1952)  dicyclomine (BENTYL) injection 20 mg (20 mg Intramuscular Given 07/26/16 1952)     Initial Impression / Assessment and Plan / ED Course  I have reviewed the triage vital signs and the nursing notes.  Pertinent labs & imaging results that were available during my care of the patient were reviewed by me and considered in my medical decision making (see chart for details).  Clinical Course     Patient with abdominal cramping and diarrhea. Suspect related to Flagyl or possibly viral infection. Patient is instructed to stop the Flagyl she's given Bentyl and Zofran. She will follow-up in a couple days for recheck.  Patient vomited mild amount of blood in the emergency department. Orthostatics were done which were fine. Patient had normal labs. Patient was put on protonic. Suspect the patient may have a Mallory-Weiss tear. She was referred to GI and will return if she vomits much blood before then  Final Clinical Impressions(s) / ED Diagnoses   Final diagnoses:  Epigastric pain    New Prescriptions New Prescriptions   DICYCLOMINE (BENTYL) 20 MG TABLET    Take one every 6 hours as needed for abd cramps   ONDANSETRON (ZOFRAN) 4 MG TABLET    Take 1 tablet (4 mg total) by mouth every 6 (six) hours.     Bethann Berkshire, MD 07/26/16 1610    Bethann Berkshire, MD 07/26/16 2218

## 2016-07-27 ENCOUNTER — Encounter: Payer: Self-pay | Admitting: *Deleted

## 2016-07-28 ENCOUNTER — Encounter: Payer: Self-pay | Admitting: Gastroenterology

## 2016-07-28 ENCOUNTER — Ambulatory Visit (INDEPENDENT_AMBULATORY_CARE_PROVIDER_SITE_OTHER): Payer: BLUE CROSS/BLUE SHIELD | Admitting: Gastroenterology

## 2016-07-28 ENCOUNTER — Other Ambulatory Visit (INDEPENDENT_AMBULATORY_CARE_PROVIDER_SITE_OTHER): Payer: BLUE CROSS/BLUE SHIELD

## 2016-07-28 VITALS — BP 120/80 | HR 84 | Temp 98.3°F | Ht 67.0 in | Wt 367.0 lb

## 2016-07-28 DIAGNOSIS — R1084 Generalized abdominal pain: Secondary | ICD-10-CM

## 2016-07-28 DIAGNOSIS — R197 Diarrhea, unspecified: Secondary | ICD-10-CM | POA: Diagnosis not present

## 2016-07-28 DIAGNOSIS — K920 Hematemesis: Secondary | ICD-10-CM | POA: Diagnosis not present

## 2016-07-28 DIAGNOSIS — R1110 Vomiting, unspecified: Secondary | ICD-10-CM

## 2016-07-28 LAB — BASIC METABOLIC PANEL WITH GFR
BUN: 7 mg/dL (ref 6–23)
CO2: 30 meq/L (ref 19–32)
Calcium: 8.9 mg/dL (ref 8.4–10.5)
Chloride: 106 meq/L (ref 96–112)
Creatinine, Ser: 0.83 mg/dL (ref 0.40–1.20)
GFR: 109.33 mL/min
Glucose, Bld: 97 mg/dL (ref 70–99)
Potassium: 3.7 meq/L (ref 3.5–5.1)
Sodium: 141 meq/L (ref 135–145)

## 2016-07-28 MED ORDER — PANTOPRAZOLE SODIUM 40 MG PO TBEC: 40.0000 mg | DELAYED_RELEASE_TABLET | Freq: Two times a day (BID) | ORAL | 3 refills | Status: DC

## 2016-07-28 NOTE — Progress Notes (Signed)
07/28/2016 Renee Stanley [id number] 28-Sep-1990   HISTORY OF PRESENT ILLNESS:  This is a 25 year old female who had sudden onset midabdominal pain, nausea, vomiting just last week that coincides with her starting Flagyl for bacterial vaginosis. Reports taking this antibiotic without side effects in the past. Also reports diarrhea just for the past 2 days as well.  She went to the ER a couple of days ago. They did not perform any imaging, but CBC, CMP, and lipase were unremarkable except for a slightly low potassium at 3.3.  She also reports an episode of hematemesis in the emergency department. She showed me pictures of a moderate amount of blood in her emesis material. She reports that she had been vomiting a significant amount prior to that episode. She was placed on pantoprazole 20 mg daily, Bentyl, and Zofran, was discharged home and told to follow up with GI. She says she did not get the Zofran because it was expensive and she thought that she could go without it. She describes the pain as being diffuse in her mid abdomen. Rates it currently as an 8 out of 10. Never had similar symptoms in the past. Denies NSAID use.  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  Referred here by the EDP.  Past Medical History:  Diagnosis Date  . Obesity   . Vaginitis    Past Surgical History:  Procedure Laterality Date  . NO PAST SURGERIES      reports that she has never smoked. She has never used smokeless tobacco. She reports that she does not drink alcohol or use drugs. family history includes Breast cancer in her mother; Diabetes in her other. Allergies  Allergen Reactions  . Mushroom Extract Complex Anaphylaxis  . Fish-Derived Products Rash      Outpatient Encounter Prescriptions as of 07/28/2016  Medication Sig  . dicyclomine (BENTYL) 20 MG tablet Take one every 6 hours for abd cramps  . metroNIDAZOLE (FLAGYL) 500 MG tablet Take 1 tablet (500 mg total) by mouth 2 (two) times daily. One po  bid x 7 days  . norgestimate-ethinyl estradiol (ORTHO-CYCLEN,SPRINTEC,PREVIFEM) 0.25-35 MG-MCG tablet Take 1 tablet by mouth daily. Start at the beginning of your next Depo Provera shot to decrease menstruation.  . [DISCONTINUED] pantoprazole (PROTONIX) 20 MG tablet Take 1 tablet (20 mg total) by mouth daily.  . ondansetron (ZOFRAN) 4 MG tablet Take 1 tablet (4 mg total) by mouth every 6 (six) hours. (Patient not taking: Reported on 07/28/2016)  . pantoprazole (PROTONIX) 40 MG tablet Take 1 tablet (40 mg total) by mouth 2 (two) times daily.  . [DISCONTINUED] acetaminophen (TYLENOL) 500 MG tablet Take 500 mg by mouth every 6 (six) hours as needed for moderate pain (pain).  . [DISCONTINUED] cephALEXin (KEFLEX) 500 MG capsule Take 1 capsule (500 mg total) by mouth 4 (four) times daily. (Patient not taking: Reported on 07/26/2016)  . [DISCONTINUED] dicyclomine (BENTYL) 20 MG tablet Take one every 6 hours as needed for abd cramps  . [DISCONTINUED] ibuprofen (ADVIL,MOTRIN) 600 MG tablet Take 1 tablet (600 mg total) by mouth every 6 (six) hours as needed for pain. (Patient not taking: Reported on 07/26/2016)  . [DISCONTINUED] ondansetron (ZOFRAN) 4 MG tablet Take 1 tablet (4 mg total) by mouth every 6 (six) hours.   No facility-administered encounter medications on file as of 07/28/2016.      REVIEW OF SYSTEMS  : All other systems reviewed and negative except where noted in the History of Present Illness.  PHYSICAL EXAM: BP 120/80 Comment: large cuff  Pulse 84   Temp 98.3 F (36.8 C)   Ht 5\' 7"  (1.702 m)   Wt (!) 367 lb (166.5 kg)   LMP 07/19/2016   BMI 57.48 kg/m  General: Well developed black female in no acute distress Head: Normocephalic and atraumatic Eyes:  Sclerae anicteric, conjunctiva pink. Ears: Normal auditory acuity Lungs: Clear throughout to auscultation Heart: Regular rate and rhythm Abdomen: Soft, obese, non-distended.  BS present.  Mild to moderated diffuse  TTP. Musculoskeletal: Symmetrical with no gross deformities  Skin: No lesions on visible extremities Extremities: No edema  Neurological: Alert oriented x 4, grossly non-focal Psychological:  Alert and cooperative. Normal mood and affect  ASSESSMENT AND PLAN: -25 year old female with sudden onset midabdominal pain, nausea, vomiting just last week that coincides with her starting Flagyl for bacterial vaginosis. Reports taking this antibiotic without side effects in the past. Also reports diarrhea just for the past 2 days. It is hard to say that her symptoms are not related to antibiotic so we will see how she feels upon completion of that, but due to her complaints of significant abdominal pain we will order a CT scan of the abdomen and pelvis with contrast. She also reports an episode of hematemesis as well, which was likely related to Mallory-Weiss tear versus wretch gastropathy. This has not been recurrent. We will increase her PPI to pantoprazole 40 mg twice a day for now.    CC:  No ref. provider found

## 2016-07-28 NOTE — Patient Instructions (Signed)
We have sent the following medications to your pharmacy for you to pick up at your convenience: Pantoprazole 40 mg twice daily (increase from 20 mg once daily dosing)  Your physician has requested that you go to the basement for the following lab work before leaving today: BMET  You have been scheduled for a CT scan of the abdomen and pelvis at [facility] ([address]---this is in the same building as Press [facility]).   You are scheduled on Monday, 08/03/16 at 2:00 pm. You should arrive 15 minutes prior to your appointment time for registration. Please follow the written instructions below on the day of your exam:  WARNING: IF YOU ARE ALLERGIC TO IODINE/X-RAY DYE, PLEASE NOTIFY RADIOLOGY IMMEDIATELY AT [phone]! YOU WILL BE GIVEN A 13 HOUR PREMEDICATION PREP.  1) Do not eat or drink anything after 10:00 am (4 hours prior to your test) 2) You have been given 2 bottles of oral contrast to drink. The solution may taste better if refrigerated, but do NOT add ice or any other liquid to this solution. Shake well before drinking.    Drink 1 bottle of contrast @ 8:00 am (2 hours prior to your exam)  Drink 1 bottle of contrast @ 9:00 am (1 hour prior to your exam)  You may take any medications as prescribed with a small amount of water except for the following: Metformin, Glucophage, Glucovance, Avandamet, Riomet, Fortamet, Actoplus Met, Janumet, Glumetza or Metaglip. The above medications must be held the day of the exam AND 48 hours after the exam.  The purpose of you drinking the oral contrast is to aid in the visualization of your intestinal tract. The contrast solution may cause some diarrhea. Before your exam is started, you will be given a small amount of fluid to drink. Depending on your individual set of symptoms, you may also receive an intravenous injection of x-ray contrast/dye. Plan on being at [facility] for 30 minutes or longer, depending on the type of  exam you are having performed.  This test typically takes 30-45 minutes to complete.  If you have any questions regarding your exam or if you need to reschedule, you may call the CT department at [phone] between the hours of 8:00 am and 5:00 pm, Monday-Friday.  ________________________________________________________________________ If you are age 67 or older, your body mass index should be between 23-30. Your Body mass index is 57.48 kg/m. If this is out of the aforementioned range listed, please consider follow up with your Primary Care Provider.  If you are age 5 or younger, your body mass index should be between 19-25. Your Body mass index is 57.48 kg/m. If this is out of the aformentioned range listed, please consider follow up with your Primary Care Provider.

## 2016-07-29 ENCOUNTER — Emergency Department (HOSPITAL_COMMUNITY): Payer: BLUE CROSS/BLUE SHIELD

## 2016-07-29 ENCOUNTER — Emergency Department (HOSPITAL_COMMUNITY)
Admission: EM | Admit: 2016-07-29 | Discharge: 2016-07-30 | Discharge status: Against Medical Advice | Payer: BLUE CROSS/BLUE SHIELD | Attending: Emergency Medicine | Admitting: Emergency Medicine

## 2016-07-29 DIAGNOSIS — R1010 Upper abdominal pain, unspecified: Secondary | ICD-10-CM | POA: Insufficient documentation

## 2016-07-29 DIAGNOSIS — Z79899 Other long term (current) drug therapy: Secondary | ICD-10-CM | POA: Diagnosis not present

## 2016-07-29 LAB — COMPREHENSIVE METABOLIC PANEL
ALBUMIN: 3.5 g/dL (ref 3.5–5.0)
ALK PHOS: 80 U/L (ref 38–126)
ALT: 31 U/L (ref 14–54)
AST: 22 U/L (ref 15–41)
Anion gap: 6 (ref 5–15)
BILIRUBIN TOTAL: 0.5 mg/dL (ref 0.3–1.2)
BUN: 8 mg/dL (ref 6–20)
CALCIUM: 8.5 mg/dL — AB (ref 8.9–10.3)
CO2: 25 mmol/L (ref 22–32)
CREATININE: 0.76 mg/dL (ref 0.44–1.00)
Chloride: 108 mmol/L (ref 101–111)
GFR calc Af Amer: >60 mL/min (ref >60)
GLUCOSE: 116 mg/dL — AB (ref 65–99)
Potassium: 3.2 mmol/L — ABNORMAL LOW (ref 3.5–5.1)
Sodium: 139 mmol/L (ref 135–145)
TOTAL PROTEIN: 7.5 g/dL (ref 6.5–8.1)

## 2016-07-29 LAB — URINALYSIS, ROUTINE W REFLEX MICROSCOPIC
Glucose, UA: NEGATIVE mg/dL
Hgb urine dipstick: NEGATIVE
KETONES UR: NEGATIVE mg/dL
NITRITE: NEGATIVE
PROTEIN: NEGATIVE mg/dL
Specific Gravity, Urine: 1.027 (ref 1.005–1.030)
pH: 6 (ref 5.0–8.0)

## 2016-07-29 LAB — URINE MICROSCOPIC-ADD ON

## 2016-07-29 LAB — POC URINE PREG, ED: Preg Test, Ur: NEGATIVE

## 2016-07-29 LAB — CBC
HCT: 38.4 % (ref 36.0–46.0)
Hemoglobin: 12.7 g/dL (ref 12.0–15.0)
MCH: 27 pg (ref 26.0–34.0)
MCHC: 33.1 g/dL (ref 30.0–36.0)
MCV: 81.5 fL (ref 78.0–100.0)
PLATELETS: 205 10*3/uL (ref 150–400)
RBC: 4.71 MIL/uL (ref 3.87–5.11)
RDW: 13.9 % (ref 11.5–15.5)
WBC: 6.3 10*3/uL (ref 4.0–10.5)

## 2016-07-29 LAB — LIPASE, BLOOD: Lipase: 18 U/L (ref 11–51)

## 2016-07-29 MED ORDER — HALOPERIDOL LACTATE 5 MG/ML IJ SOLN
2.0000 mg | Freq: Once | INTRAMUSCULAR | Status: AC
  Administered 2016-07-29: 2 mg via INTRAVENOUS
  Filled 2016-07-29: qty 1

## 2016-07-29 MED ORDER — IOPAMIDOL (ISOVUE-300) INJECTION 61%
INTRAVENOUS | Status: AC
  Filled 2016-07-29: qty 100

## 2016-07-29 MED ORDER — IOPAMIDOL (ISOVUE-300) INJECTION 61%
100.0000 mL | Freq: Once | INTRAVENOUS | Status: AC | PRN
  Administered 2016-07-29: 100 mL via INTRAVENOUS

## 2016-07-29 MED ORDER — SODIUM CHLORIDE 0.9 % IV BOLUS (SEPSIS)
1000.0000 mL | Freq: Once | INTRAVENOUS | Status: AC
  Administered 2016-07-29: 1000 mL via INTRAVENOUS

## 2016-07-29 MED ORDER — KETOROLAC TROMETHAMINE 30 MG/ML IJ SOLN
30.0000 mg | Freq: Once | INTRAMUSCULAR | Status: AC
  Administered 2016-07-29: 30 mg via INTRAVENOUS
  Filled 2016-07-29: qty 1

## 2016-07-29 MED ORDER — SODIUM CHLORIDE 0.9 % IJ SOLN
INTRAMUSCULAR | Status: AC
  Filled 2016-07-29: qty 50

## 2016-07-29 NOTE — ED Provider Notes (Addendum)
[facility]-EMERGENCY DEPT Provider Note   CSN: [id number] Arrival date & time: 07/29/16  1952 By signing my name below, I, Levon Hedger, attest that this documentation has been prepared under the direction and in the presence of non-physician practitioner, Antony Madura, PA-C. Electronically Signed: Levon Hedger, Scribe. 07/29/2016. 10:07 PM.    History   Chief Complaint Chief Complaint  Patient presents with  . Abdominal Pain    HPI Renee Stanley is a 25 y.o. female who presents to the Emergency Department complaining of worsening upper abdominal pain which began last week, worse in the last 3 days. Pt describes her pain as sharp and severe. She notes associated nausea, vomiting, hematemesis, diarrhea and decreased appetite. Pain is exacerbated by eating. She has been compliant with her medication which has provided no relief. Her last episode of vomiting was tonight at 7:30. She was seen in the ED three days ago for the same and referred to the GI. Pt was seen at [facility] yesterday and is to have a CT scan. She was recently prescribed Flagyl for vaginitis. Pt finished her flagyl today and states she has not had any alcohol while taking this.  No abdominal SHx. Pt denies any vaginal complaints, fever, or hematochezia.  The history is provided by the patient. No language interpreter was used.    Past Medical History:  Diagnosis Date  . Obesity   . Vaginitis     Patient Active Problem List   Diagnosis Date Noted  . Hematemesis with nausea 07/28/2016  . Generalized abdominal pain 07/28/2016  . Diarrhea 07/28/2016    Past Surgical History:  Procedure Laterality Date  . NO PAST SURGERIES      OB History    Gravida Para Term Preterm AB Living   0 0 0 0 0 0   SAB TAB Ectopic Multiple Live Births   0 0 0 0 0       Home Medications    Prior to Admission medications   Medication Sig Start Date End Date Taking? Authorizing Provider  dicyclomine (BENTYL) 20 MG tablet Take one  every 6 hours for abd cramps 07/26/16  Yes Bethann Berkshire, MD  medroxyPROGESTERone (DEPO-PROVERA) 150 MG/ML injection Inject 150 mg into the muscle every 3 (three) months.   Yes Historical Provider, MD  norgestimate-ethinyl estradiol (ORTHO-CYCLEN,SPRINTEC,PREVIFEM) 0.25-35 MG-MCG tablet Take 1 tablet by mouth daily. Start at the beginning of your next Depo Provera shot to decrease menstruation. 07/19/16  Yes Elizabeth Woodland Mumaw, DO  pantoprazole (PROTONIX) 40 MG tablet Take 1 tablet (40 mg total) by mouth 2 (two) times daily. 07/28/16  Yes Jessica D Zehr, PA-C  ondansetron (ZOFRAN) 4 MG tablet Take 1 tablet (4 mg total) by mouth every 6 (six) hours. Patient not taking: Reported on 07/28/2016 07/26/16   Bethann Berkshire, MD    Family History Family History  Problem Relation Age of Onset  . Breast cancer Mother     age of dx unsure  . Diabetes Other     fathers side of the family  . Colon cancer Neg Hx     Social History Social History  Substance Use Topics  . Smoking status: Never Smoker  . Smokeless tobacco: Never Used  . Alcohol use No     Allergies   Mushroom extract complex and Fish-derived products   Review of Systems Review of Systems 10 systems reviewed and all are negative for acute change except as noted in the HPI.   Physical Exam Updated Vital Signs  BP 129/68 (BP Location: Left Arm)   Pulse 77   Temp 97.8 F (36.6 C) (Oral)   Resp 20   LMP 07/19/2016   SpO2 100%   Physical Exam  Constitutional: She is oriented to person, place, and time. She appears well-developed and well-nourished. No distress.  Nontoxic appearing  HENT:  Head: Normocephalic and atraumatic.  Eyes: Conjunctivae and EOM are normal. No scleral icterus.  Neck: Normal range of motion.  Cardiovascular: Normal rate, regular rhythm and intact distal pulses.   Pulmonary/Chest: Effort normal. No respiratory distress. She has no wheezes. She has no rales.  Abdominal: Soft. She exhibits no  mass. There is tenderness. There is no guarding.  Soft, obese abdomen. LUQ and RUQ TTP. Also epigastric TTP. Negative Murphy's sign. No peritoneal signs.  Musculoskeletal: Normal range of motion.  Neurological: She is alert and oriented to person, place, and time. She exhibits normal muscle tone. Coordination normal.  Skin: Skin is warm and dry. No rash noted. She is not diaphoretic. No erythema. No pallor.  Psychiatric: She has a normal mood and affect. Her behavior is normal.  Nursing note and vitals reviewed.    ED Treatments / Results  DIAGNOSTIC STUDIES:  Oxygen Saturation is 95% on RA, adequate by my interpretation.    COORDINATION OF CARE:  10:05 PM Will order CT abdomen pelvis. Discussed treatment plan which includes Toradol and haldol with pt at bedside and pt agreed to plan.   Labs (all labs ordered are listed, but only abnormal results are displayed) Labs Reviewed  COMPREHENSIVE METABOLIC PANEL - Abnormal; Notable for the following:       Result Value   Potassium 3.2 (*)    Glucose, Bld 116 (*)    Calcium 8.5 (*)    All other components within normal limits  URINALYSIS, ROUTINE W REFLEX MICROSCOPIC (NOT AT [facility]) - Abnormal; Notable for the following:    Color, Urine AMBER (*)    APPearance CLOUDY (*)    Bilirubin Urine SMALL (*)    Leukocytes, UA SMALL (*)    All other components within normal limits  URINE MICROSCOPIC-ADD ON - Abnormal; Notable for the following:    Squamous Epithelial / LPF 0-5 (*)    Bacteria, UA FEW (*)    All other components within normal limits  LIPASE, BLOOD  CBC  POC URINE PREG, ED    EKG  EKG Interpretation None       Radiology Ct Abdomen Pelvis W Contrast  Result Date: 07/29/2016 CLINICAL DATA:  Initial evaluation for acute worsening upper abdominal pain over past week. Associated with nausea, vomiting, diarrhea. EXAM: CT ABDOMEN AND PELVIS WITH CONTRAST TECHNIQUE: Multidetector CT imaging of the abdomen and pelvis was  performed using the standard protocol following bolus administration of intravenous contrast. CONTRAST:  100mL ISOVUE-300 IOPAMIDOL (ISOVUE-300) INJECTION 61% COMPARISON:  None. FINDINGS: Lower chest: Visualized lung bases are clear. No pleural or pericardial effusion. Hepatobiliary: Liver demonstrates a normal contrast enhanced appearance. Gallbladder within normal limits. No biliary dilatation. Pancreas: Pancreas within normal limits. Spleen: Spleen within normal limits. Adrenals/Urinary Tract: Adrenal glands are normal. Kidneys equal in size with symmetric enhancement. No nephrolithiasis, hydronephrosis, or focal enhancing renal mass. Ureters of normal caliber without acute abnormality. Partially distended bladder within normal limits. Stomach/Bowel: Small hiatal hernia noted. Stomach otherwise unremarkable. No evidence for bowel obstruction. Appendix within normal limits. Mild colonic diverticulosis without evidence for acute diverticulitis. No abnormal wall thickening, mucosal enhancement, or inflammatory fat stranding seen about the bowels.  Vascular/Lymphatic: Normal intravascular enhancement seen throughout the intra-abdominal aorta and its branch vessels. No adenopathy. Reproductive: Uterus and right ovary unremarkable. 2.8 cm left ovarian cyst noted, likely a normal physiologic cyst. Other: No free air or fluid. Tiny fat containing paraumbilical hernia noted. Musculoskeletal: No acute osseous abnormality. No worrisome lytic or blastic osseous lesions. IMPRESSION: 1. No CT evidence for acute intra-abdominal or pelvic process. No findings to explain patient's symptoms identified. 2. Mild colonic diverticulosis without evidence for acute diverticulitis. 3. Normal appendix. 4. Small hiatal hernia. Electronically Signed   By: Rise MuBenjamin  McClintock M.D.   On: 07/29/2016 23:35    Procedures Procedures (including critical care time)  Medications Ordered in ED Medications  iopamidol (ISOVUE-300) 61 %  injection (not administered)  sodium chloride 0.9 % injection (not administered)  sodium chloride 0.9 % bolus 1,000 mL (1,000 mLs Intravenous New Bag/Given 07/29/16 2232)  ketorolac (TORADOL) 30 MG/ML injection 30 mg (30 mg Intravenous Given 07/29/16 2232)  haloperidol lactate (HALDOL) injection 2 mg (2 mg Intravenous Given 07/29/16 2232)  iopamidol (ISOVUE-300) 61 % injection 100 mL (100 mLs Intravenous Contrast Given 07/29/16 2257)     Initial Impression / Assessment and Plan / ED Course  I have reviewed the triage vital signs and the nursing notes.  Pertinent labs & imaging results that were available during my care of the patient were reviewed by me and considered in my medical decision making (see chart for details).  Clinical Course     Patient presenting for abdominal pain. Seen in the ED and by outpatient GI with reassuring work ups. Labs c/w baseline and CT scan negative for acute process. When patient went to be reassessed and to share the results of her scan, she was no longer in the room. All personal belongings gone; patient noted to have eloped from the department.   Final Clinical Impressions(s) / ED Diagnoses   Final diagnoses:  Pain of upper abdomen    New Prescriptions New Prescriptions   No medications on file    I personally performed the services described in this documentation, which was scribed in my presence. The recorded information has been reviewed and is accurate.      Antony MaduraKelly Lynnex Fulp, PA-C 07/29/16 2352    Mancel BaleElliott Wentz, MD 07/29/16 2359    Antony MaduraKelly Miguel Medal, PA-C 08/20/16 2316    Mancel BaleElliott Wentz, MD 08/21/16 [phone]

## 2016-07-29 NOTE — Progress Notes (Signed)
Agree with initial assessment and plans as outlined. Discussed with physician assistant

## 2016-07-29 NOTE — ED Triage Notes (Addendum)
Pt reports worsening abd pain, diarrhea and inability to keep down food since being seen here 11/12. Pt reports Rx is providing no relief to s/s. Pt is A+OX4.

## 2016-07-29 NOTE — ED Notes (Signed)
Bed: WA01 Expected date:  Expected time:  Means of arrival:  Comments: TR

## 2016-07-30 NOTE — ED Notes (Signed)
Pt left before being discharged. Pt did not wait for a set of vitals or to have peripheral iv properly removed. Attempted to call pt several times concerning peripheral iv but pt did not answer.

## 2016-08-03 ENCOUNTER — Inpatient Hospital Stay: Admission: RE | Admit: 2016-08-03 | Payer: BLUE CROSS/BLUE SHIELD | Source: Ambulatory Visit

## 2017-11-11 ENCOUNTER — Encounter (HOSPITAL_COMMUNITY): Payer: Self-pay | Admitting: *Deleted

## 2017-11-11 ENCOUNTER — Other Ambulatory Visit: Payer: Self-pay

## 2017-11-11 DIAGNOSIS — H5711 Ocular pain, right eye: Secondary | ICD-10-CM | POA: Diagnosis present

## 2017-11-11 DIAGNOSIS — Z5321 Procedure and treatment not carried out due to patient leaving prior to being seen by health care provider: Secondary | ICD-10-CM | POA: Insufficient documentation
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
## 2017-11-12 ENCOUNTER — Other Ambulatory Visit: Payer: Self-pay

## 2017-11-12 ENCOUNTER — Encounter (HOSPITAL_COMMUNITY): Payer: Self-pay | Admitting: Emergency Medicine

## 2017-11-12 ENCOUNTER — Ambulatory Visit (HOSPITAL_COMMUNITY)
Admission: EM | Admit: 2017-11-12 | Discharge: 2017-11-12 | Disposition: A | Discharge status: Home / Self Care | Payer: BLUE CROSS/BLUE SHIELD | Attending: Emergency Medicine | Admitting: Emergency Medicine

## 2017-11-12 ENCOUNTER — Emergency Department (HOSPITAL_COMMUNITY)
Admission: EM | Admit: 2017-11-12 | Discharge: 2017-11-12 | Disposition: A | Discharge status: Home / Self Care | Payer: BLUE CROSS/BLUE SHIELD | Attending: Emergency Medicine | Admitting: Emergency Medicine

## 2017-11-12 DIAGNOSIS — L03211 Cellulitis of face: Secondary | ICD-10-CM | POA: Diagnosis not present

## 2017-11-12 MED ORDER — IBUPROFEN 600 MG PO TABS: 600.0000 mg | ORAL_TABLET | Freq: Four times a day (QID) | ORAL | 0 refills | Status: AC | PRN

## 2017-11-12 MED ORDER — DOXYCYCLINE HYCLATE 100 MG PO CAPS: 100.0000 mg | ORAL_CAPSULE | Freq: Two times a day (BID) | ORAL | 0 refills | Status: AC

## 2017-11-12 NOTE — ED Triage Notes (Signed)
Pt states Friday she started having some swelling under her R eye, states "I just came home from [place] and I think a bug bit me". C/o pain, burning, itching.

## 2017-11-12 NOTE — ED Provider Notes (Signed)
HPI  SUBJECTIVE:  Renee Stanley is a 27 y.o. female who presents with a red painful "bump" inferior to her right eye starting 6 days ago.  She describes the pain is throbbing, stinging, sore, constant.  It has not changed in size or color since it started.  No known insect bite, trauma to the area.  It does not itch.  No allergy symptoms.  States that her vision is fine.  No eye pain.  No rash elsewhere.  No eye drainage, crusting, fevers.  She tried Benadryl and ice without improvement in her symptoms.  Symptoms are worse in the morning.  No contacts with MRSA.  Past medical history negative for diabetes, MRSA.  LMP: Beginning of February.  Denies the possibility being pregnant.  [place] In [place]  She goes to school here.    Past Medical History:  Diagnosis Date  . Obesity   . Vaginitis     Past Surgical History:  Procedure Laterality Date  . NO PAST SURGERIES      Family History  Problem Relation Age of Onset  . Breast cancer Mother        age of dx unsure  . Diabetes Other        fathers side of the family  . Colon cancer Neg Hx     Social History   Tobacco Use  . Smoking status: Never Smoker  . Smokeless tobacco: Never Used  Substance Use Topics  . Alcohol use: No  . Drug use: No    No current facility-administered medications for this encounter.   Current Outpatient Medications:  .  doxycycline (VIBRAMYCIN) 100 MG capsule, Take 1 capsule (100 mg total) by mouth 2 (two) times daily for 7 days., Disp: 14 capsule, Rfl: 0 .  ibuprofen (ADVIL,MOTRIN) 600 MG tablet, Take 1 tablet (600 mg total) by mouth every 6 (six) hours as needed., Disp: 30 tablet, Rfl: 0 .  medroxyPROGESTERone (DEPO-PROVERA) 150 MG/ML injection, Inject 150 mg into the muscle every 3 (three) months., Disp: , Rfl:   Allergies  Allergen Reactions  . Mushroom Extract Complex Anaphylaxis  . Other Hives    Peanuts   . Fish-Derived Products Rash     ROS  As noted in HPI.   Physical Exam  BP  (!) 160/86   Pulse 71   Temp 98.8 F (37.1 C)   Resp 18   LMP 10/15/2017   SpO2 100%   Constitutional: Well developed, well nourished, no acute distress Eyes:  EOMI, conjunctiva normal bilaterally.  1.5 x 0.5 cm tender area of erythema, induration inferior to the right eye.  No appreciable expressible purulent drainage from the tear duct.  No central fluctuance.  See picture.    HENT: Normocephalic, atraumatic,mucus membranes moist Respiratory: Normal inspiratory effort Cardiovascular: Normal rate GI: nondistended skin: No rash, skin intact Musculoskeletal: no deformities Neurologic: Alert & oriented x 3, no focal neuro deficits Psychiatric: Speech and behavior appropriate   ED Course   Medications - No data to display  No orders of the defined types were placed in this encounter.   No results found for this or any previous visit (from the past 24 hour(s)). No results found.  ED Clinical Impression  Cellulitis of face   ED Assessment/Plan  Presentation consistent with a cellulitis.  There is nothing to I&D at this time.  Does not appear to be dacryoadenitis.  Home with warm compresses, doxycycline, 600 mg ibuprofen to take with 1 g of Tylenol 3-4 times  a day.  She will follow-up with student health or here as needed.  She will return here or go to the ER if she gets worse.  Discussed MDM, plan and followup with patient. Discussed sn/sx that should prompt return to the ED. patient agrees with plan.   Meds ordered this encounter  Medications  . ibuprofen (ADVIL,MOTRIN) 600 MG tablet    Sig: Take 1 tablet (600 mg total) by mouth every 6 (six) hours as needed.    Dispense:  30 tablet    Refill:  0  . doxycycline (VIBRAMYCIN) 100 MG capsule    Sig: Take 1 capsule (100 mg total) by mouth 2 (two) times daily for 7 days.    Dispense:  14 capsule    Refill:  0    *This clinic note was created using Scientist, clinical (histocompatibility and immunogenetics). Therefore, there may be occasional mistakes  despite careful proofreading.   ?   Domenick Gong, MD 11/13/17 1318

## 2017-11-12 NOTE — ED Notes (Signed)
Per registration staff, the pt left.

## 2017-11-12 NOTE — Discharge Instructions (Signed)
warm compresses, doxycycline, 600 mg ibuprofen to take with 1 g of Tylenol 3-4 times a day.  Follow-up with student health or here as needed.  return here if not getting better or go to the ER if you get worse and for the signs and symptoms we discussed.

## 2018-02-22 ENCOUNTER — Encounter (HOSPITAL_COMMUNITY): Payer: Self-pay

## 2018-02-22 DIAGNOSIS — Z79899 Other long term (current) drug therapy: Secondary | ICD-10-CM | POA: Insufficient documentation

## 2018-02-22 DIAGNOSIS — Y92410 Unspecified street and highway as the place of occurrence of the external cause: Secondary | ICD-10-CM | POA: Diagnosis not present

## 2018-02-22 DIAGNOSIS — S8001XA Contusion of right knee, initial encounter: Secondary | ICD-10-CM | POA: Insufficient documentation

## 2018-02-22 DIAGNOSIS — Y998 Other external cause status: Secondary | ICD-10-CM | POA: Diagnosis not present

## 2018-02-22 DIAGNOSIS — Y9389 Activity, other specified: Secondary | ICD-10-CM | POA: Insufficient documentation

## 2018-02-22 DIAGNOSIS — S8991XA Unspecified injury of right lower leg, initial encounter: Secondary | ICD-10-CM | POA: Diagnosis present
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
## 2018-02-23 ENCOUNTER — Emergency Department (HOSPITAL_COMMUNITY): Payer: BLUE CROSS/BLUE SHIELD

## 2018-02-23 ENCOUNTER — Emergency Department (HOSPITAL_COMMUNITY)
Admission: EM | Admit: 2018-02-23 | Discharge: 2018-02-23 | Disposition: A | Discharge status: Home / Self Care | Payer: BLUE CROSS/BLUE SHIELD | Attending: Emergency Medicine | Admitting: Emergency Medicine

## 2018-02-23 DIAGNOSIS — S8001XA Contusion of right knee, initial encounter: Secondary | ICD-10-CM

## 2018-02-23 MED ORDER — NAPROXEN 500 MG PO TABS: 500.0000 mg | ORAL_TABLET | Freq: Two times a day (BID) | ORAL | 0 refills | Status: DC

## 2018-02-23 MED ORDER — IBUPROFEN 200 MG PO TABS
600.0000 mg | ORAL_TABLET | Freq: Once | ORAL | Status: AC
  Administered 2018-02-23: 600 mg via ORAL
  Filled 2018-02-23: qty 3
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
## 2018-02-23 NOTE — Discharge Instructions (Signed)
Ice and elevate your knee. Naprosyn for pain. ACE wrap for support. Follow up with family doctor if not improving.
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
## 2018-11-23 ENCOUNTER — Ambulatory Visit (INDEPENDENT_AMBULATORY_CARE_PROVIDER_SITE_OTHER): Payer: BLUE CROSS/BLUE SHIELD

## 2018-11-23 ENCOUNTER — Ambulatory Visit (HOSPITAL_COMMUNITY)
Admission: EM | Admit: 2018-11-23 | Discharge: 2018-11-23 | Disposition: A | Discharge status: Home / Self Care | Payer: BLUE CROSS/BLUE SHIELD | Attending: Internal Medicine | Admitting: Internal Medicine

## 2018-11-23 ENCOUNTER — Other Ambulatory Visit: Payer: Self-pay

## 2018-11-23 ENCOUNTER — Encounter (HOSPITAL_COMMUNITY): Payer: Self-pay | Admitting: Emergency Medicine

## 2018-11-23 DIAGNOSIS — M25562 Pain in left knee: Secondary | ICD-10-CM

## 2018-11-23 DIAGNOSIS — R6889 Other general symptoms and signs: Secondary | ICD-10-CM

## 2018-11-23 DIAGNOSIS — M1712 Unilateral primary osteoarthritis, left knee: Secondary | ICD-10-CM

## 2018-11-23 MED ORDER — OSELTAMIVIR PHOSPHATE 75 MG PO CAPS: 75.0000 mg | ORAL_CAPSULE | Freq: Two times a day (BID) | ORAL | 0 refills | Status: DC

## 2018-11-23 NOTE — ED Triage Notes (Signed)
Pt c/o cough, sore throat, congestion x1 week. Pt also states [place] and [place] her L leg, c/o L knee pain.

## 2019-06-22 NOTE — ED Provider Notes (Signed)
[facility]    CSN: [id number] Arrival date & time: 11/23/18  1722      History   Chief Complaint Chief Complaint  Patient presents with  . Leg Pain  . Cough    HPI Renee Stanley is a 28 y.o. female.   Pt c/o cough, sore throat, congestion x1 week.  Cough is non-productive.  Sick contacts with flu-like symtoms     Past Medical History:  Diagnosis Date  . Obesity   . Vaginitis     Patient Active Problem List   Diagnosis Date Noted  . Hematemesis with nausea 07/28/2016  . Generalized abdominal pain 07/28/2016  . Diarrhea 07/28/2016    Past Surgical History:  Procedure Laterality Date  . NO PAST SURGERIES      OB History    Gravida  0   Para  0   Term  0   Preterm  0   AB  0   Living  0     SAB  0   TAB  0   Ectopic  0   Multiple  0   Live Births  0            Home Medications    Prior to Admission medications   Medication Sig Start Date End Date Taking? Authorizing Provider  ibuprofen (ADVIL,MOTRIN) 600 MG tablet Take 1 tablet (600 mg total) by mouth every 6 (six) hours as needed. Patient not taking: Reported on 11/23/2018 11/12/17   Domenick Gong, MD  medroxyPROGESTERone (DEPO-PROVERA) 150 MG/ML injection Inject 150 mg into the muscle every 3 (three) months.    [provider]  naproxen (NAPROSYN) 500 MG tablet Take 1 tablet (500 mg total) by mouth 2 (two) times daily. Patient not taking: Reported on 11/23/2018 02/23/18   Jaynie Crumble, PA-C  oseltamivir (TAMIFLU) 75 MG capsule Take 1 capsule (75 mg total) by mouth every 12 (twelve) hours. 11/23/18   Arnaldo Natal, MD    Family History Family History  Problem Relation Age of Onset  . Breast cancer Mother        age of dx unsure  . Diabetes Other        fathers side of the family  . Colon cancer Neg Hx     Social History Social History   Tobacco Use  . Smoking status: Never Smoker  . Smokeless tobacco: Never Used  Substance Use Topics  .  Alcohol use: No  . Drug use: No     Allergies   Mushroom extract complex, Other, and Fish-derived products   Review of Systems Review of Systems  Constitutional: Negative for chills and fever.  HENT: Negative for sore throat and tinnitus.   Eyes: Negative for redness.  Respiratory: Negative for cough and shortness of breath.   Cardiovascular: Negative for chest pain and palpitations.  Gastrointestinal: Negative for abdominal pain, diarrhea, nausea and vomiting.  Genitourinary: Negative for dysuria, frequency and urgency.  Musculoskeletal: Negative for myalgias.  Skin: Negative for rash.       No lesions  Neurological: Negative for weakness.  Hematological: Does not bruise/bleed easily.  Psychiatric/Behavioral: Negative for suicidal ideas.     Physical Exam Triage Vital Signs ED Triage Vitals [11/23/18 1814]  Enc Vitals Group     BP      Pulse      Resp      Temp      Temp src      SpO2      Weight  Height      Head Circumference      Peak Flow      Pain Score 8     Pain Loc      Pain Edu?      Excl. in [place]?    No data found.  Updated Vital Signs LMP 10/30/2018   Visual Acuity Right Eye Distance:   Left Eye Distance:   Bilateral Distance:    Right Eye Near:   Left Eye Near:    Bilateral Near:     Physical Exam Vitals signs and nursing note reviewed.  Constitutional:      General: She is not in acute distress.    Appearance: She is well-developed.  HENT:     Head: Normocephalic and atraumatic.  Eyes:     General: No scleral icterus.    Conjunctiva/sclera: Conjunctivae normal.     Pupils: Pupils are equal, round, and reactive to light.  Neck:     Musculoskeletal: Normal range of motion and neck supple.     Thyroid: No thyromegaly.     Vascular: No JVD.     Trachea: No tracheal deviation.  Cardiovascular:     Rate and Rhythm: Normal rate and regular rhythm.     Heart sounds: Normal heart sounds. No murmur. No friction rub. No gallop.    Pulmonary:     Effort: Pulmonary effort is normal.     Breath sounds: Normal breath sounds.  Abdominal:     General: Bowel sounds are normal. There is no distension.     Palpations: Abdomen is soft.     Tenderness: There is no abdominal tenderness.  Musculoskeletal: Normal range of motion.  Lymphadenopathy:     Cervical: No cervical adenopathy.  Skin:    General: Skin is warm and dry.  Neurological:     Mental Status: She is alert and oriented to person, place, and time.     Cranial Nerves: No cranial nerve deficit.  Psychiatric:        Behavior: Behavior normal.        Thought Content: Thought content normal.        Judgment: Judgment normal.      UC Treatments / Results  Labs (all labs ordered are listed, but only abnormal results are displayed) Labs Reviewed - No data to display  EKG   Radiology No results found.  Procedures Procedures (including critical care time)  Medications Ordered in UC Medications - No data to display  Initial Impression / Assessment and Plan / UC Course  I have reviewed the triage vital signs and the nursing notes.  Pertinent labs & imaging results that were available during my care of the patient were reviewed by me and considered in my medical decision making (see chart for details).     Body aches and viral symptoms gives high suspicion for flu Final Clinical Impressions(s) / UC Diagnoses   Final diagnoses:  Flu-like symptoms  Osteoarthritis of left knee, unspecified osteoarthritis type   Discharge Instructions   None    ED Prescriptions    Medication Sig Dispense Auth. Provider   oseltamivir (TAMIFLU) 75 MG capsule Take 1 capsule (75 mg total) by mouth every 12 (twelve) hours. 10 capsule Harrie Foreman, MD     PDMP not reviewed this encounter.   Harrie Foreman, MD 06/22/19 [phone]

## 2019-12-13 DIAGNOSIS — E6609 Other obesity due to excess calories: Secondary | ICD-10-CM | POA: Insufficient documentation

## 2020-09-19 DIAGNOSIS — E282 Polycystic ovarian syndrome: Secondary | ICD-10-CM | POA: Insufficient documentation

## 2020-10-09 DIAGNOSIS — G4733 Obstructive sleep apnea (adult) (pediatric): Secondary | ICD-10-CM | POA: Insufficient documentation

## 2021-01-13 HISTORY — PX: GASTRIC BYPASS: SHX52

## 2021-05-27 DIAGNOSIS — F329 Major depressive disorder, single episode, unspecified: Secondary | ICD-10-CM | POA: Insufficient documentation

## 2021-11-04 ENCOUNTER — Emergency Department (HOSPITAL_COMMUNITY)
Admission: EM | Admit: 2021-11-04 | Discharge: 2021-11-06 | Disposition: A | Discharge status: Home / Self Care | Payer: BLUE CROSS/BLUE SHIELD | Source: Home / Self Care | Attending: Emergency Medicine | Admitting: Emergency Medicine

## 2021-11-04 ENCOUNTER — Encounter (HOSPITAL_COMMUNITY): Payer: Self-pay

## 2021-11-04 DIAGNOSIS — R45851 Suicidal ideations: Secondary | ICD-10-CM

## 2021-11-04 DIAGNOSIS — T50902A Poisoning by unspecified drugs, medicaments and biological substances, intentional self-harm, initial encounter: Secondary | ICD-10-CM

## 2021-11-04 DIAGNOSIS — E876 Hypokalemia: Secondary | ICD-10-CM | POA: Insufficient documentation

## 2021-11-04 DIAGNOSIS — T43292A Poisoning by other antidepressants, intentional self-harm, initial encounter: Secondary | ICD-10-CM | POA: Insufficient documentation

## 2021-11-04 DIAGNOSIS — Z20822 Contact with and (suspected) exposure to covid-19: Secondary | ICD-10-CM | POA: Insufficient documentation

## 2021-11-04 DIAGNOSIS — Y900 Blood alcohol level of less than 20 mg/100 ml: Secondary | ICD-10-CM | POA: Insufficient documentation

## 2021-11-04 DIAGNOSIS — F332 Major depressive disorder, recurrent severe without psychotic features: Secondary | ICD-10-CM | POA: Diagnosis not present

## 2021-11-04 LAB — RESP PANEL BY RT-PCR (FLU A&B, COVID) ARPGX2
Influenza A by PCR: NEGATIVE
Influenza B by PCR: NEGATIVE
SARS Coronavirus 2 by RT PCR: NEGATIVE

## 2021-11-04 LAB — COMPREHENSIVE METABOLIC PANEL
ALT: 17 U/L (ref 0–44)
AST: 24 U/L (ref 15–41)
Albumin: 3.5 g/dL (ref 3.5–5.0)
Alkaline Phosphatase: 80 U/L (ref 38–126)
Anion gap: 8 (ref 5–15)
BUN: 7 mg/dL (ref 6–20)
CO2: 24 mmol/L (ref 22–32)
Calcium: 8.7 mg/dL — ABNORMAL LOW (ref 8.9–10.3)
Chloride: 105 mmol/L (ref 98–111)
Creatinine, Ser: 0.64 mg/dL (ref 0.44–1.00)
GFR, Estimated: >60 mL/min (ref >60)
Glucose, Bld: 106 mg/dL — ABNORMAL HIGH (ref 70–99)
Potassium: 2.7 mmol/L — CL (ref 3.5–5.1)
Sodium: 137 mmol/L (ref 135–145)
Total Bilirubin: 0.6 mg/dL (ref 0.3–1.2)
Total Protein: 7 g/dL (ref 6.5–8.1)

## 2021-11-04 LAB — ACETAMINOPHEN LEVEL
Acetaminophen (Tylenol), Serum: <10 ug/mL — ABNORMAL LOW (ref 10–30)
Acetaminophen (Tylenol), Serum: <10 ug/mL — ABNORMAL LOW (ref 10–30)

## 2021-11-04 LAB — CBC
HCT: 37.9 % (ref 36.0–46.0)
Hemoglobin: 12.9 g/dL (ref 12.0–15.0)
MCH: 30 pg (ref 26.0–34.0)
MCHC: 34 g/dL (ref 30.0–36.0)
MCV: 88.1 fL (ref 80.0–100.0)
Platelets: 148 10*3/uL — ABNORMAL LOW (ref 150–400)
RBC: 4.3 MIL/uL (ref 3.87–5.11)
RDW: 13.1 % (ref 11.5–15.5)
WBC: 4 10*3/uL (ref 4.0–10.5)
nRBC: 0 % (ref 0.0–0.2)

## 2021-11-04 LAB — RAPID URINE DRUG SCREEN, HOSP PERFORMED
Amphetamines: POSITIVE — AB
Barbiturates: NOT DETECTED
Benzodiazepines: NOT DETECTED
Cocaine: NOT DETECTED
Opiates: NOT DETECTED
Tetrahydrocannabinol: POSITIVE — AB

## 2021-11-04 LAB — I-STAT BETA HCG BLOOD, ED (MC, WL, AP ONLY): I-stat hCG, quantitative: <5 m[IU]/mL (ref <5)

## 2021-11-04 LAB — POTASSIUM: Potassium: 3.6 mmol/L (ref 3.5–5.1)

## 2021-11-04 LAB — MAGNESIUM: Magnesium: 1.8 mg/dL (ref 1.7–2.4)

## 2021-11-04 LAB — ETHANOL: Alcohol, Ethyl (B): <10 mg/dL (ref <10)

## 2021-11-04 LAB — SALICYLATE LEVEL: Salicylate Lvl: <7 mg/dL — ABNORMAL LOW (ref 7.0–30.0)

## 2021-11-04 MED ORDER — OLANZAPINE 5 MG PO TBDP: 5.0000 mg | ORAL_TABLET | Freq: Three times a day (TID) | ORAL | Status: DC | PRN

## 2021-11-04 MED ORDER — SODIUM CHLORIDE 0.9 % IV SOLN
1000.0000 mL | INTRAVENOUS | Status: DC
  Administered 2021-11-04: 1000 mL via INTRAVENOUS

## 2021-11-04 MED ORDER — POTASSIUM CHLORIDE CRYS ER 20 MEQ PO TBCR
40.0000 meq | EXTENDED_RELEASE_TABLET | Freq: Once | ORAL | Status: AC
  Administered 2021-11-04: 40 meq via ORAL
  Filled 2021-11-04: qty 2

## 2021-11-04 MED ORDER — POTASSIUM CHLORIDE 10 MEQ/100ML IV SOLN
10.0000 meq | INTRAVENOUS | Status: AC
  Administered 2021-11-04 (×2): 10 meq via INTRAVENOUS
  Filled 2021-11-04 (×2): qty 100

## 2021-11-04 MED ORDER — ONDANSETRON HCL 4 MG PO TABS: 4.0000 mg | ORAL_TABLET | Freq: Three times a day (TID) | ORAL | Status: DC | PRN

## 2021-11-04 MED ORDER — ACETAMINOPHEN 325 MG PO TABS: 650.0000 mg | ORAL_TABLET | ORAL | Status: DC | PRN

## 2021-11-04 MED ORDER — ZIPRASIDONE MESYLATE 20 MG IM SOLR: 20.0000 mg | INTRAMUSCULAR | Status: DC | PRN

## 2021-11-04 MED ORDER — ZOLPIDEM TARTRATE 5 MG PO TABS: 5.0000 mg | ORAL_TABLET | Freq: Every evening | ORAL | Status: DC | PRN

## 2021-11-04 MED ORDER — SODIUM CHLORIDE 0.9 % IV BOLUS (SEPSIS)
1000.0000 mL | Freq: Once | INTRAVENOUS | Status: AC
Start: 2021-11-04 — End: 2021-11-05
  Administered 2021-11-04: 1000 mL via INTRAVENOUS

## 2021-11-04 MED ORDER — LORAZEPAM 1 MG PO TABS: 1.0000 mg | ORAL_TABLET | ORAL | Status: DC | PRN

## 2021-11-04 MED ORDER — CHARCOAL ACTIVATED PO LIQD
50.0000 g | Freq: Once | ORAL | Status: AC
  Administered 2021-11-04: 50 g via ORAL
  Filled 2021-11-04: qty 240

## 2021-11-04 NOTE — ED Notes (Signed)
Family member is in room right now with pt, sitter is sitting outside door while visit is going on. RN was notified.

## 2021-11-04 NOTE — ED Notes (Signed)
Vikki Ports from [employer] recommends repeat EKG, if QTC is >500, advises to replace more electorlytes. If <500, no intervention. States she will call back

## 2021-11-04 NOTE — ED Notes (Signed)
Pt has been changed out into [place] scrubs and pt belongings are in the cabinet at nurses station in 1-8. Pt had a shirt, pants, shoes, earrings.

## 2021-11-04 NOTE — ED Notes (Signed)
Pt appears tired in bed. A/ox4, states she attempted suicide today by taking 30-40 50mg  Trazadone pills and one ETOH drink. Pt denies other drugs. Pt states stress in life made her want to do this. Pt denies wanting to die at this time, hallucinations or voices. Pt changed into purple scrubs, in room with sitter, on monitor. VSS.

## 2021-11-04 NOTE — ED Notes (Signed)
First visitor has left pt is  relaxing

## 2021-11-04 NOTE — ED Notes (Addendum)
Pt father Rosalia Hammers has asked if there are any changes with patient can you please up date him or pt mother. The Fathers number is [phone] and mother name is Chantise her number is [phone]. Dad is in town and mom stayed in [place]. Father has pt belongings.

## 2021-11-04 NOTE — ED Provider Notes (Addendum)
Signout note  31 year old girl presenting to ER after trazodone overdose.  Poison control was called. Pt has been given dose of activated charcoal. They recommend observe for at least 6 hours postingestion.  No intervention needed at present, supportive measures.  Basic labs noted hypokalemia.  Plan to replace potassium and repeat.  TTS order placed.  Patient currently voluntary.  Reassessed patient, she is calm and cooperative.  Her vitals are normal.  She is interested in talking with psychiatry about getting mental health assistance.  She denies ongoing suicidal thoughts but does endorse depression.  Repeat potassium is now within normal range.  Patient is medically stable for psychiatric evaluation.  Will place psych hold orders while waiting TTS eval.   Renee Loll, MD 11/04/21 2120    Renee Loll, MD 11/04/21 2122

## 2021-11-04 NOTE — ED Notes (Signed)
Per CN pt is allowed to have three visitors for the day, for 30 minutes that are timed.But between each visit they have to wait 30  minutes before another one comes back.

## 2021-11-04 NOTE — ED Triage Notes (Signed)
Pt is not under IVC at this time

## 2021-11-04 NOTE — ED Provider Notes (Signed)
[facility]-EMERGENCY DEPT Provider Note   CSN: [id number] Arrival date & time: 11/04/21  1353     History  Chief Complaint  Patient presents with   Drug Overdose   Suicide Attempt    Renee Stanley is a 31 y.o. female.   Drug Overdose   Patient presented to the ED for evaluation after a suicide attempt.  EMS reports patient is under IVC however police have actually not filled out any paperwork.  Patient states she ingested 40 to 5050 mg trazodone tablets a couple of hours ago.  Patient did do this with the intent to harm herself.  Patient states she has been going through a lot and has been feeling very depressed.  She has not seen anyone for depression previously.  Patient states she takes trazodone for insomnia.  Patient states right now she regrets her decision she does not want to die.  She wants help.  Patient did vomit after the ingestion.  She also had loose stools.  She does not recall throwing up any pill fragments.  Home Medications Prior to Admission medications   Medication Sig Start Date End Date Taking? Authorizing Provider  ibuprofen (ADVIL,MOTRIN) 600 MG tablet Take 1 tablet (600 mg total) by mouth every 6 (six) hours as needed. Patient not taking: Reported on 11/23/2018 11/12/17   Domenick Gong, MD  medroxyPROGESTERone (DEPO-PROVERA) 150 MG/ML injection Inject 150 mg into the muscle every 3 (three) months.    [provider]  naproxen (NAPROSYN) 500 MG tablet Take 1 tablet (500 mg total) by mouth 2 (two) times daily. Patient not taking: Reported on 11/23/2018 02/23/18   Jaynie Crumble, PA-C  oseltamivir (TAMIFLU) 75 MG capsule Take 1 capsule (75 mg total) by mouth every 12 (twelve) hours. 11/23/18   Arnaldo Natal, MD      Allergies    Mushroom extract complex, Other, and Fish-derived products    Review of Systems   Review of Systems  Constitutional:  Negative for fever.   Physical Exam Updated Vital Signs BP 136/82     Pulse 67    Temp 98.2 F (36.8 C) (Oral)    Resp 16    Ht 1.727 m (5\' 8" )    Wt 108.9 kg    SpO2 97%    BMI 36.49 kg/m  Physical Exam Vitals and nursing note reviewed.  Constitutional:      General: She is not in acute distress.    Appearance: She is well-developed.  HENT:     Head: Normocephalic and atraumatic.     Right Ear: External ear normal.     Left Ear: External ear normal.  Eyes:     General: No scleral icterus.       Right eye: No discharge.        Left eye: No discharge.     Conjunctiva/sclera: Conjunctivae normal.  Neck:     Trachea: No tracheal deviation.  Cardiovascular:     Rate and Rhythm: Normal rate and regular rhythm.  Pulmonary:     Effort: Pulmonary effort is normal. No respiratory distress.     Breath sounds: Normal breath sounds. No stridor. No wheezing or rales.  Abdominal:     General: Bowel sounds are normal. There is no distension.     Palpations: Abdomen is soft.     Tenderness: There is no abdominal tenderness. There is no guarding or rebound.  Musculoskeletal:        General: No tenderness or deformity.  Cervical back: Neck supple.  Skin:    General: Skin is warm and dry.     Findings: No rash.  Neurological:     General: No focal deficit present.     Mental Status: She is alert.     Cranial Nerves: No cranial nerve deficit (no facial droop, extraocular movements intact, no slurred speech).     Sensory: No sensory deficit.     Motor: No abnormal muscle tone or seizure activity.     Coordination: Coordination normal.  Psychiatric:        Mood and Affect: Mood is depressed. Affect is flat.        Speech: Speech is not delayed.        Thought Content: Thought content includes suicidal ideation.    ED Results / Procedures / Treatments   Labs (all labs ordered are listed, but only abnormal results are displayed) Labs Reviewed  COMPREHENSIVE METABOLIC PANEL - Abnormal; Notable for the following components:      Result Value   Potassium  2.7 (*)    Glucose, Bld 106 (*)    Calcium 8.7 (*)    All other components within normal limits  SALICYLATE LEVEL - Abnormal; Notable for the following components:   Salicylate Lvl <7.0 (*)    All other components within normal limits  ACETAMINOPHEN LEVEL - Abnormal; Notable for the following components:   Acetaminophen (Tylenol), Serum <10 (*)    All other components within normal limits  CBC - Abnormal; Notable for the following components:   Platelets 148 (*)    All other components within normal limits  ETHANOL  RAPID URINE DRUG SCREEN, HOSP PERFORMED  MAGNESIUM  I-STAT BETA HCG BLOOD, ED ([facility], [facility], [facility] ONLY)  CBG MONITORING, ED    EKG EKG Interpretation  Date/Time:  Tuesday November 04 2021 14:15:03 EST Ventricular Rate:  70 PR Interval:  185 QRS Duration: 111 QT Interval:  585 QTC Calculation: 632 R Axis:   11 Text Interpretation: Sinus rhythm Borderline T wave abnormalities Prolonged QT interval Confirmed by Linwood Dibbles [id number]) on 11/04/2021 2:36:37 PM  Radiology No results found.  Procedures Procedures    Medications Ordered in ED Medications  sodium chloride 0.9 % bolus 1,000 mL (1,000 mLs Intravenous New Bag/Given 11/04/21 1454)    Followed by  0.9 %  sodium chloride infusion (1,000 mLs Intravenous New Bag/Given 11/04/21 1538)  potassium chloride 10 mEq in 100 mL IVPB (10 mEq Intravenous New Bag/Given 11/04/21 1537)  charcoal activated (NO SORBITOL) (ACTIDOSE-AQUA) suspension 50 g (50 g Oral Given 11/04/21 1528)  potassium chloride SA (KLOR-CON M) CR tablet 40 mEq (40 mEq Oral Given 11/04/21 1534)    ED Course/ Medical Decision Making/ A&P Clinical Course as of 11/04/21 1641  Tue Nov 04, 2021  1439 Discussed with poison control.  Primarily supportive care.  6 hours observation, repeat EKG, optimize potassium and magnesium.  Monitor blood pressure closely.  Could consider charcoal although may not be too effective this far out [JK]  1528 Comprehensive metabolic  panel(!!) Potassium decreased at 2.7.  Will replace [JK]  1640 Patient remains stable.  No hypotension.  Remains alert in no distress [JK]    Clinical Course User Index [JK] Linwood Dibbles, MD                           Medical Decision Making Amount and/or Complexity of Data Reviewed Labs: ordered. Decision-making details documented in ED Course.  Risk OTC drugs. Prescription drug management.   Patient is not currently under IVC.  She states she does not want to die and is asking for help.  We will proceed with evaluation and contact poison control.  If patient changes her mind and decides she wants to leave I would place her under IVC at that time  Will need psychiatric evaluation  Patient remains cooperative.  At this time continues to be medically stable. IV potassium replacement ordered. We will continue to observe for another few hours. Cons tts.  Care turned over to Dr Stevie Kern       Final Clinical Impression(s) / ED Diagnoses Final diagnoses:  Intentional drug overdose, initial encounter [facility])  Suicidal ideation  Hypokalemia     Linwood Dibbles, MD 11/05/21 [phone]

## 2021-11-04 NOTE — ED Notes (Addendum)
Kathlene November from [employer]- states repeat tylenol and EKG now.

## 2021-11-04 NOTE — ED Triage Notes (Signed)
Pt BIBA and [employer] under IVC for SI attempt. Pt reports she ingested 40-50 of 50mg  trazodone prescription about 2 hrs pta. States she 'didn't want to wake up'. States she does not feel that way any more. Hx depression, meds increase her thoughts so has not taken in a long time. A&Ox4. 20ga RAC  BP 122/78 HR 70 CBG 100 SpO2 99% RA 12 lead slightly long QT noted otherwise unremarkable

## 2021-11-05 ENCOUNTER — Other Ambulatory Visit: Payer: Self-pay

## 2021-11-05 NOTE — ED Provider Notes (Signed)
Emergency Medicine Observation Re-evaluation Note  Renee Stanley is a 31 y.o. female, seen on rounds today.  Pt initially presented to the ED for complaints of Drug Overdose and Suicide Attempt Currently, the patient is asleep.  Physical Exam  BP 119/79 (BP Location: Left Arm)    Pulse 62    Temp (!) 97.4 F (36.3 C) (Oral)    Resp 17    Ht 5\' 8"  (1.727 m)    Wt 108.9 kg    SpO2 98%    BMI 36.49 kg/m  Physical Exam General: NAD Cardiac: well perfused Lungs: even an unlabored Psych: no agitation  ED Course / MDM  EKG:EKG Interpretation  Date/Time:  Tuesday November 04 2021 14:15:03 EST Ventricular Rate:  70 PR Interval:  185 QRS Duration: 111 QT Interval:  585 QTC Calculation: 632 R Axis:   11 Text Interpretation: Sinus rhythm Borderline T wave abnormalities Prolonged QT interval Confirmed by [birth year] [phone]) on 11/04/2021 2:36:37 PM  I have reviewed the labs performed to date as well as medications administered while in observation.  Recent changes in the last 24 hours include patient presented to Trazadone overdose, cleared medically and poison control notified.  Plan  Current plan is for TTS consult.  Renee Stanley is not under involuntary commitment.     Renee Amabile, MD 11/05/21 [phone]

## 2021-11-05 NOTE — ED Notes (Signed)
Poison Control called at this time to check on status of patient.  Reviewed EKG.  Per Poison Control, EKG has improved and findings are "going in the right direction."

## 2021-11-05 NOTE — BH Assessment (Signed)
Per Jinny Blossom, NP, patient recommended for inpt treatment.

## 2021-11-05 NOTE — BH Assessment (Signed)
Comprehensive Clinical Assessment (CCA) Note  11/05/2021 [name] [id number]  Per Jinny Blossom, NP, patient recommended for inpt treatment.  [place] ED from 11/04/2021 in [place] DEPT  C-SSRS RISK CATEGORY High Risk      The patient demonstrates the following risk factors for suicide: Chronic risk factors for suicide include: psychiatric disorder of depression . Acute risk factors for suicide include: unemployment and loss (financial, interpersonal, professional). Protective factors for this patient include: positive social support, positive therapeutic relationship, responsibility to others (children, family), hope for the future, and religious beliefs against suicide. Considering these factors, the overall suicide risk at this point appears to be high. Patient is not appropriate for outpatient follow up.  Renee Stanley is a 52 year od female presenting to [facility] voluntarily with chief complaint of overdosing on 40-50 50mg  of trazadone on 11/04/21. Patient reports she was overwhelmed with a lot of emotions and made the mistake of trying to end her life which now she regrets. Patient reports stressors related to schoolwork (PhD) program, losing her job, conflict with coworkers, financial issues and talking to a guy that was not right for me. Patient also reports weigh loss surgery May 2022 and reports she lost her usual way to cope with stress when she had the surgery. After overdosing patient reports immediate regret and reports attempting to vomit medications and reports that her friend called EMS. Patient states I had one thing happen after the other and I did not know how to cope with it. Patient also reports having death dreams for the past month and in these dreams she would not accomplish her life goals and at the end of the dream she would be injured or die.   Patient was prescribed medications from the weight loss clinic and reports some of the  medications increased her SI. Patient reports she did not like how it was making her feel so she stopped taking the medications in January. Patient does not have outpatient services and does not have a history of inpatient treatment. Patient reports THC use about two weeks ago and reports using infrequently. Patient denies using other drugs and alcohol. Patient attends school at [employer] in the doctorial program. Patient reports she lost her job recently, however, is supposed to start work on 11/10/21 at [employer]. Patient lives alone and she reports her firearm was taken and given to her father who came to be supportive. Patient reports a strong support system who was aware of her depression, but they did not to what extent she was depressed. Patient denies legal issues.  Patient is oriented x5, engaged, alert and cooperative during assessment. Patient eye contact and speech is normal, her affect is appropriate with congruent mood. Patient denies SI, HI, AVH and contracts for safety. This is patient first suicide attempt. Patient is currently voluntary, and she would rather follow up with outpatient treatment instead of going inpatient. TTS explained to patient that inpatient treatment would probably be recommended and if she is not willing to go voluntarily, she would have to be IVC'd.   Chief Complaint:  Chief Complaint  Patient presents with   Drug Overdose   Suicide Attempt   Visit Diagnosis:  Intentional drug overdose, initial encounter ([place])  Suicidal ideation       CCA Screening, Triage and Referral (STR)  Patient Reported Information How did you hear about [place]? Other (Comment)  What Is the Reason for Your Visit/Call Today? Overdose  How Long Has This Been Causing  You Problems? 1 wk - 1 month  What Do You Feel Would Help You the Most Today? Treatment for Depression or other mood problem   Have You Recently Had Any Thoughts About Hurting Yourself? Yes  Are You Planning to Commit  Suicide/Harm Yourself At This time? No   Have you Recently Had Thoughts About [place]? No  Are You Planning to Harm Someone at This Time? No  Explanation: No data recorded  Have You Used Any Alcohol or Drugs in the Past 24 Hours? No  How Long Ago Did You Use Drugs or Alcohol? No data recorded What Did You Use and How Much? No data recorded  Do You Currently Have a Therapist/Psychiatrist? No  Name of Therapist/Psychiatrist: No data recorded  Have You Been Recently Discharged From Any Office Practice or Programs? No  Explanation of Discharge From Practice/Program: No data recorded    CCA Screening Triage Referral Assessment Type of Contact: Tele-Assessment  Telemedicine Service Delivery: Telemedicine service delivery: This service was provided via telemedicine using a 2-way, interactive audio and video technology  Is this Initial or Reassessment? Initial Assessment  Date Telepsych consult ordered in [facility]:  11/05/21  Time Telepsych consult ordered in [facility]:  No data recorded Location of Assessment: [facility] ED  Provider Location: [facility] Assessment Services   Collateral Involvement: none   Does Patient Have a [place]? No data recorded Name and Contact of Legal Guardian: No data recorded If Minor and Not Living with Parent(s), Who has Custody? No data recorded Is CPS involved or ever been involved? No data recorded Is APS involved or ever been involved? No data recorded  Patient Determined To Be At Risk for Harm To Self or Others Based on Review of Patient Reported Information or Presenting Complaint? Yes, for Self-Harm  Method: No data recorded Availability of Means: No data recorded Intent: No data recorded Notification Required: No data recorded Additional Information for Danger to Others Potential: No data recorded Additional Comments for Danger to Others Potential: No data recorded Are There Guns or Other Weapons in Your Home? No data  recorded Types of Guns/Weapons: No data recorded Are These Weapons Safely Secured?                            No data recorded Who Could Verify You Are Able To Have These Secured: No data recorded Do You Have any Outstanding Charges, Pending Court Dates, Parole/Probation? No data recorded Contacted To Inform of Risk of Harm To Self or Others: No data recorded   Does Patient Present under Involuntary Commitment? No  IVC Papers Initial File Date: No data recorded  [place] of Residence: [place]   Patient Currently Receiving the Following Services: Medication Management   Determination of Need: Emergent (2 hours)   Options For Referral: Outpatient Therapy; Medication Management; Inpatient Hospitalization     CCA Biopsychosocial Patient Reported Schizophrenia/Schizoaffective Diagnosis in Past: No   Strengths: No data recorded  Mental Health Symptoms Depression:   Change in energy/activity; Difficulty Concentrating; Hopelessness; Increase/decrease in appetite; Irritability; Sleep (too much or little); Tearfulness; Worthlessness   Duration of Depressive symptoms:  Duration of Depressive Symptoms: Greater than two weeks   Mania:   None   Anxiety:    Worrying; Tension   Psychosis:   None   Duration of Psychotic symptoms:    Trauma:   None   Obsessions:   None   Compulsions:   None  Inattention:   None   Hyperactivity/Impulsivity:   None   Oppositional/Defiant Behaviors:   None   Emotional Irregularity:   Potentially harmful impulsivity   Other Mood/Personality Symptoms:  No data recorded   Mental Status Exam Appearance and self-care  Stature:   Average   Weight:   Overweight   Clothing:   Age-appropriate   Grooming:   Normal   Cosmetic use:   None   Posture/gait:   Normal   Motor activity:   Not Remarkable   Sensorium  Attention:   Normal   Concentration:   Normal   Orientation:   X5   Recall/memory:   Normal   Affect  and Mood  Affect:   Full Range   Mood:   Depressed   Relating  Eye contact:   Normal   Facial expression:   Depressed; Responsive   Attitude toward examiner:   Cooperative   Thought and Language  Speech flow:  Clear and Coherent   Thought content:   Appropriate to Mood and Circumstances   Preoccupation:   None   Hallucinations:   None   Organization:  No data recorded  [employer] of Knowledge:   Good   Intelligence:   Average   Abstraction:   Normal   Judgement:   Good   Reality Testing:   Adequate   Insight:   Good   Decision Making:   Normal   Social Functioning  Social Maturity:   Responsible   Social Judgement:   Normal   Stress  Stressors:   Museum/gallery curator; School; Relationship; Work   Coping Ability:   Programme researcher, broadcasting/film/video Deficits:   None   Supports:   Family; Friends/Service system     Religion: Religion/Spirituality Are You A Religious Person?: No  Leisure/Recreation:    Exercise/Diet: Exercise/Diet Have You Gained or Lost A Significant Amount of Weight in the Past Six Months?: Yes-Lost Do You Follow a Special Diet?: No Do You Have Any Trouble Sleeping?: No   CCA Employment/Education Employment/Work Situation: Employment / Work Situation Employment Situation: Radio broadcast assistant Job has Been Impacted by Current Illness: No Has Patient ever Been in the [employer]?: No  Education: Education Is Patient Currently Attending School?: Yes School Currently Attending: [employer] Last Grade Completed:  (PHD prgram) Did You Attend College?: Yes Did You Have An Individualized Education Program (IIEP): No Did You Have Any Difficulty At School?: No Patient's Education Has Been Impacted by Current Illness: No   CCA Family/Childhood History Family and Relationship History: Family history Marital status: Single Does patient have children?: No  Childhood History:  Childhood History By whom was/is the patient  raised?: Both parents Did patient suffer any verbal/emotional/physical/sexual abuse as a child?: No Did patient suffer from severe childhood neglect?: No Has patient ever been sexually abused/assaulted/raped as an adolescent or adult?: No Was the patient ever a victim of a crime or a disaster?: No Witnessed domestic violence?: No Has patient been affected by domestic violence as an adult?: No  Child/Adolescent Assessment:     CCA Substance Use Alcohol/Drug Use: Alcohol / Drug Use Pain Medications: See MAR Prescriptions: See MAR Over the Counter: See MAR History of alcohol / drug use?: Yes Substance #1 Name of Substance 1: THC 1 - Last Use / Amount: 2 weeks ago                       ASAM's:  Six Dimensions of Multidimensional Assessment  Dimension 1:  Acute Intoxication and/or Withdrawal Potential:      Dimension 2:  Biomedical Conditions and Complications:      Dimension 3:  Emotional, Behavioral, or Cognitive Conditions and Complications:     Dimension 4:  Readiness to Change:     Dimension 5:  Relapse, Continued use, or Continued Problem Potential:     Dimension 6:  Recovery/Living Environment:     ASAM Severity Score:    ASAM Recommended Level of Treatment: ASAM Recommended Level of Treatment: Level I Outpatient Treatment   Substance use Disorder (SUD)    Recommendations for Services/Supports/Treatments: Recommendations for Services/Supports/Treatments Recommendations For Services/Supports/Treatments: Individual Therapy  Discharge Disposition:    DSM5 Diagnoses: Patient Active Problem List   Diagnosis Date Noted   Hematemesis with nausea 07/28/2016   Generalized abdominal pain 07/28/2016   Diarrhea 07/28/2016     Referrals to Alternative Service(s): Referred to Alternative Service(s):   Place:   Date:   Time:    Referred to Alternative Service(s):   Place:   Date:   Time:    Referred to Alternative Service(s):   Place:   Date:   Time:     Referred to Alternative Service(s):   Place:   Date:   Time:     Luther Redo, [employer]

## 2021-11-06 ENCOUNTER — Inpatient Hospital Stay (HOSPITAL_COMMUNITY)
Admission: AD | Admit: 2021-11-06 | Discharge: 2021-11-08 | DRG: 885 | Disposition: A | Discharge status: Home / Self Care | Payer: BLUE CROSS/BLUE SHIELD | Source: Intra-hospital | Attending: Emergency Medicine | Admitting: Emergency Medicine

## 2021-11-06 ENCOUNTER — Encounter (HOSPITAL_COMMUNITY): Payer: Self-pay | Admitting: Psychiatry

## 2021-11-06 DIAGNOSIS — Z9884 Bariatric surgery status: Secondary | ICD-10-CM | POA: Diagnosis not present

## 2021-11-06 DIAGNOSIS — R45851 Suicidal ideations: Secondary | ICD-10-CM | POA: Diagnosis present

## 2021-11-06 DIAGNOSIS — E876 Hypokalemia: Secondary | ICD-10-CM | POA: Diagnosis present

## 2021-11-06 DIAGNOSIS — Z9102 Food additives allergy status: Secondary | ICD-10-CM

## 2021-11-06 DIAGNOSIS — Z79899 Other long term (current) drug therapy: Secondary | ICD-10-CM | POA: Diagnosis not present

## 2021-11-06 DIAGNOSIS — Z20822 Contact with and (suspected) exposure to covid-19: Secondary | ICD-10-CM | POA: Diagnosis present

## 2021-11-06 DIAGNOSIS — G4700 Insomnia, unspecified: Secondary | ICD-10-CM | POA: Diagnosis present

## 2021-11-06 DIAGNOSIS — Z91013 Allergy to seafood: Secondary | ICD-10-CM | POA: Diagnosis not present

## 2021-11-06 DIAGNOSIS — F332 Major depressive disorder, recurrent severe without psychotic features: Principal | ICD-10-CM | POA: Diagnosis present

## 2021-11-06 LAB — LIPID PANEL
Cholesterol: 182 mg/dL (ref 0–200)
HDL: 34 mg/dL — ABNORMAL LOW
LDL Cholesterol: 135 mg/dL — ABNORMAL HIGH (ref 0–99)
Total CHOL/HDL Ratio: 5.4 ratio
Triglycerides: 65 mg/dL
VLDL: 13 mg/dL (ref 0–40)

## 2021-11-06 LAB — TSH: TSH: 2.859 u[IU]/mL (ref 0.350–4.500)

## 2021-11-06 MED ORDER — OLANZAPINE 5 MG PO TBDP: 5.0000 mg | ORAL_TABLET | Freq: Three times a day (TID) | ORAL | Status: DC | PRN

## 2021-11-06 MED ORDER — EPINEPHRINE 0.3 MG/0.3ML IJ SOAJ: 0.3000 mg | Freq: Every day | INTRAMUSCULAR | Status: DC | PRN

## 2021-11-06 MED ORDER — HYDROXYZINE HCL 25 MG PO TABS: 25.0000 mg | ORAL_TABLET | Freq: Three times a day (TID) | ORAL | Status: DC | PRN

## 2021-11-06 MED ORDER — ALUM & MAG HYDROXIDE-SIMETH 200-200-20 MG/5ML PO SUSP: 30.0000 mL | Freq: Four times a day (QID) | ORAL | Status: DC | PRN

## 2021-11-06 MED ORDER — TRAZODONE HCL 50 MG PO TABS: 50.0000 mg | ORAL_TABLET | Freq: Every evening | ORAL | Status: DC | PRN

## 2021-11-06 MED ORDER — ACETAMINOPHEN 325 MG PO TABS: 650.0000 mg | ORAL_TABLET | Freq: Four times a day (QID) | ORAL | Status: DC | PRN

## 2021-11-06 MED ORDER — LORAZEPAM 1 MG PO TABS: 1.0000 mg | ORAL_TABLET | ORAL | Status: DC | PRN

## 2021-11-06 MED ORDER — ONDANSETRON HCL 4 MG PO TABS: 4.0000 mg | ORAL_TABLET | Freq: Three times a day (TID) | ORAL | Status: DC | PRN

## 2021-11-06 MED ORDER — ZIPRASIDONE MESYLATE 20 MG IM SOLR: 20.0000 mg | INTRAMUSCULAR | Status: DC | PRN

## 2021-11-06 MED ORDER — MAGNESIUM HYDROXIDE 400 MG/5ML PO SUSP: 30.0000 mL | Freq: Every day | ORAL | Status: DC | PRN

## 2021-11-06 MED ORDER — MELATONIN 3 MG PO TABS
6.0000 mg | ORAL_TABLET | Freq: Every day | ORAL | Status: DC
  Filled 2021-11-06 (×4): qty 2

## 2021-11-06 NOTE — Progress Notes (Signed)
Pt denies SI/HI/AVH and verbally agrees to approach staff if these become apparent or before harming themselves/others. Rates depression 0/10. Rates anxiety 0/10. Rates pain 0/10.  Pt has been out of room but has not interacted much with others. Pt is minimal. Scheduled medications administered to pt, per MD orders. RN provided support and encouragement to pt. Q15 min safety checks implemented and continued. Pt safe on the unit. RN will continue to monitor and intervene as needed.   11/06/21 0820  Psych Admission Type (Psych Patients Only)  Admission Status Voluntary  Psychosocial Assessment  Patient Complaints None  Eye Contact Fair  Facial Expression Sad  Affect Sad  Speech Logical/coherent  Interaction Forwards little;Minimal  Motor Activity Other (Comment) (WDL)  Appearance/Hygiene Unremarkable;In scrubs  Behavior Characteristics Cooperative;Appropriate to situation;Calm  Mood Sad;Pleasant  Thought Process  Coherency WDL  Content WDL  Delusions None reported or observed  Perception WDL  Hallucination None reported or observed  Judgment Limited  Confusion None  Danger to Self  Current suicidal ideation? Denies  Danger to Others  Danger to Others None reported or observed

## 2021-11-06 NOTE — BHH Suicide Risk Assessment (Addendum)
Suicide Risk Assessment  Admission Assessment    [facility] Admission Suicide Risk Assessment   Nursing information obtained from:  Patient Demographic factors:  Access to firearms, Living alone, Adolescent or young adult Current Mental Status:  NA Loss Factors:  Decline in physical health, Financial problems / change in socioeconomic status Historical Factors:  Impulsivity Risk Reduction Factors:  Employed  Total Time spent with patient: 1 hour Principal Problem: MDD (major depressive disorder), recurrent episode, severe (HCC) Diagnosis:  Principal Problem:   MDD (major depressive disorder), recurrent episode, severe (HCC)  History of Present Illness: Renee Stanley is a 31 y.o female with a self reported history of depression who was taken to the [facility] after taking an overdose of 40-50 tablets of Trazodone 50 mg pills with an intent to end her life. Pt was medically stabilized and voluntarily transferred to this [facility] for treatment and stabilization of her mood.   Continued Clinical Symptoms: She reports worsening anhedonia, insomnia, fatigue, hopelessness, worthlessness & helplessness, low energy, poor concentration levels, feeling overwhelmed & poor motivation. She reports her stressors as the end of a relationship, school & the loss of her job. Pt reports that her depressive symptoms began after her bariatric surgery in May 2022 and worsened over the past 2-3 weeks. Pt is at high risk for suicide, and continuous hospitalization is necessary to treat and stabilize her mood.  Alcohol Use Disorder Identification Test Final Score (AUDIT): 0 The "Alcohol Use Disorders Identification Test", Guidelines for Use in Primary Care, Second Edition.  World Science writer [facility]). Score between 0-7:  no or low risk or alcohol related problems. Score between 8-15:  moderate risk of alcohol related problems. Score between 16-19:  high risk of alcohol related problems. Score 20 or above:  warrants further  diagnostic evaluation for alcohol dependence and treatment.  CLINICAL FACTORS:   Depression:   Anhedonia Hopelessness Impulsivity Insomnia  Musculoskeletal: Strength & Muscle Tone: within normal limits Gait & Station: normal Patient leans: N/A  Psychiatric Specialty Exam:  Presentation  General Appearance: Appropriate for Environment  Eye Contact:Good  Speech:Clear and Coherent  Speech Volume:Normal  Handedness:Right  Mood and Affect  Mood:Depressed  Affect:Depressed; Flat  Thought Process  Thought Processes:Coherent  Descriptions of Associations:Intact  Orientation:Full (Time, Place and Person)  Thought Content:Logical  History of Schizophrenia/Schizoaffective disorder:No  Duration of Psychotic Symptoms:No data recorded Hallucinations:Hallucinations: None  Ideas of Reference:None  Suicidal Thoughts:Suicidal Thoughts: No  Homicidal Thoughts:Homicidal Thoughts: No  Sensorium  Memory:Immediate Good  Judgment:Fair  Insight:Poor   Executive Functions  Concentration:Fair  Attention Span:Fair  Recall:Fair  Fund of Knowledge:Fair  Language:Fair  Psychomotor Activity  Psychomotor Activity:Psychomotor Activity: Normal  Assets  Assets:Communication Skills; Housing; Social Support  Sleep  Sleep:Sleep: Poor  Physical Exam: Physical Exam HENT:     Head: Normocephalic.     Nose: No congestion.  Eyes:     Pupils: Pupils are equal, round, and reactive to light.  Pulmonary:     Effort: Pulmonary effort is normal.  Musculoskeletal:        General: Normal range of motion.     Cervical back: Normal range of motion. No rigidity.  Neurological:     Mental Status: She is alert and oriented to person, place, and time.  Psychiatric:        Behavior: Behavior normal.   Review of Systems  Constitutional: Negative.  Negative for fever.  HENT: Negative.  Negative for sore throat.   Eyes: Negative.   Respiratory: Negative.  Negative for  cough.    Cardiovascular: Negative.  Negative for chest pain.  Gastrointestinal: Negative.  Negative for heartburn.  Genitourinary: Negative.   Musculoskeletal: Negative.   Skin: Negative.   Neurological: Negative.   Endo/Heme/Allergies:        Allergies: Fish, peanuts, mushrooms  Psychiatric/Behavioral:  Positive for depression and substance abuse. The patient has insomnia.   Blood pressure 130/88, pulse 68, temperature 97.7 F (36.5 C), resp. rate 19, height 5\' 7"  (1.702 m), weight 105.2 kg, SpO2 100 %. Body mass index is 36.34 kg/m.   COGNITIVE FEATURES THAT CONTRIBUTE TO RISK:  None    SUICIDE RISK:   Moderate:  Frequent suicidal ideation with limited intensity, and duration, some specificity in terms of plans, no associated intent, good self-control, limited dysphoria/symptomatology, some risk factors present, and identifiable protective factors, including available and accessible social support.  PLAN OF CARE:    PLAN Safety and Monitoring: Voluntary admission to inpatient psychiatric unit for safety, stabilization and treatment Daily contact with patient to assess and evaluate symptoms and progress in treatment Patient's case to be discussed in multi-disciplinary team meeting Observation Level : q15 minute checks Vital signs: q12 hours Precautions: suicide   Medications MDD (major depressive disorder), recurrent episode, severe (HCC) Pt not agreeable to medication management of her symptoms at this time.   Anxiety -Continue Hydroxyzine 25 mg every 6 hours PRN   Insomnia -Discontinued Trazodone 50 mg PRN because pt overdosed on it, and is not interested in continuing the med. -Start Melatonin 6 mg nightly    Other PRNS -Continue Tylenol 650 mg every 6 hours PRN for mild pain -Continue Maalox 30 mg every 4 hrs PRN for indigestion -Start Milk of Magnesia 30 mg PRN for constipation -Continue agitation protocol PRN (Zyprexa/Ativan/Geodon) please see MAR.  I certify that  inpatient services furnished can reasonably be expected to improve the patient's condition.   , NP 11/06/2021, 5:39 PM

## 2021-11-06 NOTE — ED Notes (Signed)
Gave report to [employer], Therapist, sports, at [facility]. Patient is being transported to [facility] bed 305-2 via safe transport.

## 2021-11-06 NOTE — H&P (Addendum)
Psychiatric Admission Assessment Adult  Patient Identification: Renee Stanley MRN:  [id number] Date of Evaluation:  11/06/2021 Chief Complaint:  MDD (major depressive disorder), recurrent episode, severe (HCC) [F33.2] Principal Diagnosis: MDD (major depressive disorder), recurrent episode, severe (HCC) Diagnosis:  Principal Problem:   MDD (major depressive disorder), recurrent episode, severe (HCC)  History of Present Illness: Renee Stanley is a 31 y.o female with a self reported history of depression who was taken to the [facility] after taking an overdose of 40-50 tablets of Trazodone 50 mg pills with an intent to end her life. Pt was medically stabilized and voluntarily transferred to this [facility] for treatment and stabilization of her mood.  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 She reports worsening anhedonia, insomnia, fatigue, hopelessness, worthlessness & helplessness, low energy, poor concentration levels, feeling overwhelmed & poor motivation. She reports her stressors as the end of a relationship, school & the loss of her job. She reports that she was working at [employer] as a Designer, industrial/product, but is supposed to start another job on Monday 2/27 @ [employer]. 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 Pt denies any manic type symptoms, and denies auditory/visual disturbances. She denies paranoia, and there is no evidence of any delusional thoughts. She denies any prior suicide attempts.  Pt reports that this is her first inpatient mental health hospitalization, reports trial of Celexa in the past, and states that it worsened her mental status and she felt more depressed,  suicidal, and she stopped taking it. Pt unable to state when she tried this medication, and is currently resistant to taking any psychotropic medications here at [facility]. She has been educated on antidepressants, both SSRIs and SNRIs, and declined them. Pt also educated on anticonvulsants medications used for mood stabilization including Lamictal, Topamax & Tegretol and given printed information on Lamictal by the attending Psychiatrist. Pt educated on antipsychotics used for mood stabilization as well, and continues to refuse medication management of her symptoms at this time.   Pt reports that she was born and raised in [place], moved to [place] for school, and states she has 5 siblings who are all in [place]. She states her parents live in [place], and her father flew to [facility] after finding out about her overdose, and is currently at her apartment. Pt denies any history of emotional, physical or sexual abuse, and reports occasional marijuana use, and states that the last time she used it was two weeks ago. Toxicology screening positive for amphetamines and THC. She denies alcohol/tobacco or any other recreational drug use. She denies any mental illness history in her family.  Labs reviewed, baseline UA ordered, Hemoglobin A1C ordered and pending, TSH WNL, Lipid panel with HDL 34 and LDL 135. Pt will need Primary care provider f/u on discharge. CMP WNL, CBC unremarkable, EKG WNL.   Associated Signs/Symptoms: Depression Symptoms:  depressed mood, anhedonia, insomnia, fatigue, feelings of worthlessness/guilt, difficulty concentrating, hopelessness, suicidal attempt, loss of energy/fatigue, disturbed sleep, Duration of Depression Symptoms: Greater than two weeks  (Hypo) Manic Symptoms:  Impulsivity, Anxiety Symptoms:  Excessive Worry, Psychotic Symptoms:   n/a PTSD Symptoms: NA Total Time spent with patient: 1  hour  Past Psychiatric History: MDD  Is the patient at risk to self? Yes.    Has the patient been a risk  to self in the past 6 months? Yes.    Has the patient been a risk to self within the distant past? No.  Is the patient a risk to others? No.  Has the patient been a risk to others in the past 6 months? No.  Has the patient been a risk to others within the distant past? No.   Prior Inpatient Therapy: n/a  Prior Outpatient Therapy:  n/a  Alcohol Screening: 1. How often do you have a drink containing alcohol?: Never 2. How many drinks containing alcohol do you have on a typical day when you are drinking?: 1 or 2 3. How often do you have six or more drinks on one occasion?: Never AUDIT-C Score: 0 9. Have you or someone else been injured as a result of your drinking?: No 10. Has a relative or friend or a doctor or another health worker been concerned about your drinking or suggested you cut down?: No Alcohol Use Disorder Identification Test Final Score (AUDIT): 0 Substance Abuse History in the last 12 months:  Yes.   Consequences of Substance Abuse: NA Previous Psychotropic Medications: Yes  Psychological Evaluations: No  Past Medical History:  Past Medical History:  Diagnosis Date   Obesity    Vaginitis     Past Surgical History:  Procedure Laterality Date   GASTRIC BYPASS N/A 01/13/2021   NO PAST SURGERIES     Family History:  Family History  Problem Relation Age of Onset   Breast cancer Mother        age of dx unsure   Diabetes Other        fathers side of the family   Colon cancer Neg Hx    Family Psychiatric  History: n/a Tobacco Screening: denies Social History:  Social History   Substance and Sexual Activity  Alcohol Use Yes   Comment: rare     Social History   Substance and Sexual Activity  Drug Use Yes   Types: Marijuana    Additional Social History: Marital status: Single Are you sexually active?: Yes What is your sexual orientation?: straight Does patient have children?: No     Allergies:   Allergies  Allergen Reactions   Mushroom Extract Complex  Anaphylaxis   Other Hives    Peanuts    Fish-Derived Products Rash   Lab Results:  Results for orders placed or performed during the hospital encounter of 11/06/21 (from the past 48 hour(s))  Lipid panel     Status: Abnormal   Collection Time: 11/06/21  6:21 AM  Result Value Ref Range   Cholesterol 182 0 - 200 mg/dL   Triglycerides 65 <629<150 mg/dL   HDL 34 (L) >52>40 mg/dL   Total CHOL/HDL Ratio 5.4 RATIO   VLDL 13 0 - 40 mg/dL   LDL Cholesterol 841135 (H) 0 - 99 mg/dL    Comment:        Total Cholesterol/HDL:CHD Risk Coronary Heart Disease Risk Table                     Men   Women  1/2 Average Risk   3.4   3.3  Average Risk       5.0   4.4  2 X Average Risk   9.6   7.1  3 X Average Risk  23.4   11.0  Use the calculated Patient Ratio above and the CHD Risk Table to determine the patient's CHD Risk.        ATP III CLASSIFICATION (LDL):  <100     mg/dL   Optimal  859-292  mg/dL   Near or Above                    Optimal  130-159  mg/dL   Borderline  446-286  mg/dL   High  >381     mg/dL   Very High Performed at [facility], [address] [place], [place] [address]   TSH     Status: None   Collection Time: 11/06/21  6:21 AM  Result Value Ref Range   TSH 2.859 0.350 - 4.500 uIU/mL    Comment: Performed by a 3rd Generation assay with a functional sensitivity of <=0.01 uIU/mL. Performed at [facility], [address], [place], [place] [address]     Blood Alcohol level:  Lab Results  Component Value Date   ETH <10 11/04/2021    Metabolic Disorder Labs:  No results found for: HGBA1C, MPG No results found for: PROLACTIN Lab Results  Component Value Date   CHOL 182 11/06/2021   TRIG 65 11/06/2021   HDL 34 (L) 11/06/2021   CHOLHDL 5.4 11/06/2021   VLDL 13 11/06/2021   LDLCALC 135 (H) 11/06/2021    Current Medications: Current Facility-Administered Medications  Medication Dose Route Frequency Provider Last Rate Last  Admin   acetaminophen (TYLENOL) tablet 650 mg  650 mg Oral Q6H PRN Jackelyn Poling, NP       alum & mag hydroxide-simeth (MAALOX/MYLANTA) 200-200-20 MG/5ML suspension 30 mL  30 mL Oral Q6H PRN Starleen Blue, NP       hydrOXYzine (ATARAX) tablet 25 mg  25 mg Oral TID PRN Jackelyn Poling, NP       OLANZapine zydis (ZYPREXA) disintegrating tablet 5 mg  5 mg Oral Q8H PRN Jackelyn Poling, NP       And   LORazepam (ATIVAN) tablet 1 mg  1 mg Oral PRN Jackelyn Poling, NP       And   ziprasidone (GEODON) injection 20 mg  20 mg Intramuscular PRN Nira Conn A, NP       magnesium hydroxide (MILK OF MAGNESIA) suspension 30 mL  30 mL Oral Daily PRN Talena Neira, NP       melatonin tablet 6 mg  6 mg Oral QHS Yordin Rhoda, NP       PTA Medications: Medications Prior to Admission  Medication Sig Dispense Refill Last Dose   ibuprofen (ADVIL,MOTRIN) 600 MG tablet Take 1 tablet (600 mg total) by mouth every 6 (six) hours as needed. (Patient not taking: Reported on 11/23/2018) 30 tablet 0    naproxen (NAPROSYN) 500 MG tablet Take 1 tablet (500 mg total) by mouth 2 (two) times daily. (Patient not taking: Reported on 11/23/2018) 30 tablet 0    oseltamivir (TAMIFLU) 75 MG capsule Take 1 capsule (75 mg total) by mouth every 12 (twelve) hours. (Patient not taking: Reported on 11/04/2021) 10 capsule 0    traZODone (DESYREL) 50 MG tablet Take 50 mg by mouth at bedtime.      Musculoskeletal: Strength & Muscle Tone: within normal limits Gait & Station: normal Patient leans: N/A  Psychiatric Specialty Exam:  Presentation  General Appearance: Appropriate for Environment Eye Contact:Good Speech:Clear and Coherent Speech Volume:Normal Handedness:Right  Mood and Affect  Mood:Depressed  Affect:Depressed; Flat  Thought Process  Thought Processes:Coherent Duration of Psychotic Symptoms: No data recorded Past Diagnosis of Schizophrenia or Psychoactive disorder: No  Descriptions of  Associations:Intact  Orientation:Full (Time, Place and Person)  Thought Content:Logical  Hallucinations:Hallucinations: None  Ideas of Reference:None  Suicidal Thoughts:Suicidal Thoughts: No  Homicidal Thoughts:Homicidal Thoughts: No  Sensorium  Memory:Immediate Good Judgment:Fair Insight:Poor  Executive Functions  Concentration:Fair Attention Span:Fair Recall:Fair Fund of Knowledge:Fair Language:Fair  Psychomotor Activity  Psychomotor Activity:Psychomotor Activity: Normal  Assets  Assets:Communication Skills; Housing; Social Support  Sleep  Sleep:Sleep: Poor  Physical Exam: Physical Exam Constitutional:      Appearance: Normal appearance.  HENT:     Head: Normocephalic.     Nose: Nose normal. No congestion or rhinorrhea.  Eyes:     Pupils: Pupils are equal, round, and reactive to light.  Pulmonary:     Effort: Pulmonary effort is normal. No respiratory distress.  Musculoskeletal:     Cervical back: Normal range of motion. No rigidity.  Neurological:     General: No focal deficit present.     Mental Status: She is alert and oriented to person, place, and time.     Coordination: Coordination normal.   Review of Systems  Constitutional: Negative.  Negative for fever.  HENT: Negative.  Negative for hearing loss and sore throat.   Eyes: Negative.   Respiratory: Negative.  Negative for cough.   Cardiovascular: Negative.  Negative for chest pain and palpitations.  Gastrointestinal: Negative.  Negative for heartburn, nausea and vomiting.  Genitourinary: Negative.   Musculoskeletal: Negative.   Skin: Negative.   Neurological: Negative.  Negative for dizziness and headaches.  Psychiatric/Behavioral:  Positive for depression and substance abuse (marijuana use). The patient has insomnia.   Blood pressure 130/88, pulse 68, temperature 97.7 F (36.5 C), resp. rate 19, height 5\' 7"  (1.702 m), weight 105.2 kg, SpO2 100 %. Body mass index is 36.34 kg/m.  Treatment  Plan Summary: Daily contact with patient to assess and evaluate symptoms and progress in treatment and Medication management  Observation Level/Precautions:  15 minute checks  Laboratory:  Labs reviewed: Hemoglobin A1C ordered and pending  Psychotherapy:  Unit Group sessions  [facility]  [facility]  Consultations:  To be determined   Discharge Concerns:  Safety, medication compliance, mood stability  Estimated LOS: 5-7 days  Other:  N/A   Physician Treatment Plan for Primary Diagnosis: MDD (major depressive disorder), recurrent episode, severe (HCC)  PLAN Safety and Monitoring: Voluntary admission to inpatient psychiatric unit for safety, stabilization and treatment Daily contact with patient to assess and evaluate symptoms and progress in treatment Patient's case to be discussed in multi-disciplinary team meeting Observation Level : q15 minute checks Vital signs: q12 hours Precautions: suicide  Medications MDD (major depressive disorder), recurrent episode, severe (HCC) Pt not agreeable to medication management of her symptoms at this time.  Anxiety -Continue Hydroxyzine 25 mg every 6 hours PRN  Insomnia -Discontinued Trazodone 50 mg PRN because pt overdosed on it, and is not interested in continuing the med. -Start Melatonin 6 mg nightly   Other PRNS -Continue Tylenol 650 mg every 6 hours PRN for mild pain -Continue Maalox 30 mg every 4 hrs PRN for indigestion -Start Milk of Magnesia 30 mg PRN for constipation -Continue agitation protocol PRN (Zyprexa/Ativan/Geodon) please see MAR. -Start Epipen 0.3 mg daily PRN for anaphylaxis (peanut allergy)  Long Term Goal(s): Improvement in symptoms so as ready for discharge  Short Term Goals: Ability to identify changes in lifestyle to  reduce recurrence of condition will improve, Ability to verbalize feelings will improve, Ability to disclose and discuss suicidal ideas, Ability to demonstrate self-control will improve, Ability to  identify and develop effective coping behaviors will improve, and Ability to identify triggers associated with substance abuse/mental health issues will improve  Physician Treatment Plan for Secondary Diagnosis:  Principal Problem:   MDD (major depressive disorder), recurrent episode, severe (HCC)  Discharge Planning: Social work and case management to assist with discharge planning and identification of hospital follow-up needs prior to discharge Estimated LOS: 5-7 days Discharge Concerns: Need to establish a safety plan; Medication compliance and effectiveness Discharge Goals: Return home with outpatient referrals for mental health follow-up including medication management/psychotherapy  I certify that inpatient services furnished can reasonably be expected to improve the patient's condition.    Starleen Blue, NP 2/23/20235:30 PM

## 2021-11-06 NOTE — Group Note (Signed)
Date:  11/06/2021 Time:  4:14 PM  Group Topic/Focus:  Wellness Toolbox:   The focus of this group is to discuss various aspects of wellness, balancing those aspects and exploring ways to increase the ability to experience wellness.  Patients will create a wellness toolbox for use upon discharge.    Participation Level:  Minimal  Participation Quality:  Appropriate  Affect:  Appropriate  Cognitive:  Appropriate  Insight: Lacking  Engagement in Group:  Limited  Modes of Intervention:  Discussion  Additional Comments:    Jaquita Rector 11/06/2021, 4:14 PM

## 2021-11-06 NOTE — Progress Notes (Signed)
Patient given UA container.

## 2021-11-06 NOTE — Progress Notes (Signed)
Adult Psychoeducational Group Note  Date:  11/06/2021 Time:  9:18 PM  Group Topic/Focus:  Wrap-Up Group:   The focus of this group is to help patients review their daily goal of treatment and discuss progress on daily workbooks.  Participation Level:  Active  Participation Quality:  Appropriate  Affect:  Appropriate  Cognitive:  Appropriate  Insight: Appropriate  Engagement in Group:  Engaged  Modes of Intervention:  Discussion  Additional Comments:  exercise color gray. Patient said her day was 10. Her goal for today wake up in good spirits and grateful life. Her coping skills talk out her issues and set goals and solve anything that bothering her.  Charna Busman Long 11/06/2021, 9:18 PM

## 2021-11-06 NOTE — Progress Notes (Signed)
Beverley signed the 72 hour request for discharge 11/06/21 at 2:35am.  Form was placed on the front of the shadow chart.
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
## 2021-11-06 NOTE — Progress Notes (Addendum)
Pt accepted to [facility] Pending Vol Consent; Please fax to [phone]; Assignment 307-1.  Report phone [phone].   Pt meets criteria per: Dorise Hiss Parks,NP  Attending: Dr. Bartholomew Crews WG:YKZL disorder  Care Team notified: Silvano Bilis, RN, [facility] Fransico Michael, RN, Dorise Hiss, [place], and [place], [place].    Maryjean Ka, MSW, LCSWA 11/06/2021 12:03 AM

## 2021-11-06 NOTE — Progress Notes (Signed)
Renee Stanley is a [age] year old female being admitted voluntarily to [facility] from [facility].  She presented to the ED via EMS due to suicide attempt by taking 40-50 trazodone 50mg  tabs.  She immediately regretted the decision, tried to make herself vomit then her friend called EMS.  She reported her stressors were losing job, school work, conflict with coworkers, financial stressors and recent weight loss surgery (May 2022).  She denied any history of behavioral health treatment. She did report being on an antidepressant prescribed by her weight loss doctor but stopped the medication because it made her "thoughts worse." During [facility] admission, she was guarded and fearful at first presentation but was able calm down and participate in the admission process.  Affect flat and sad.  She denied SI/HI or AVH.  She realizes that she needs some more coping skills.  She is concerned about being able to start her new job on Monday 11/10/21 at 11/12/21.  Encouraged her to talk with MD and SW about her concerns.  She is currently attending [employer] to obtain her PHD in [place].  Admission paperwork completed and signed.  Belongings searched and secured in locker # 4.  Skin assessment completed, noted multiple tattoos and scars on abdomen from weight loss surgery.  Suicide safety plan reviewed, given to patient to complete and return to her nurse.  Q 15 minute checks initiated for safety.  We will monitor the progress towards her goals.

## 2021-11-06 NOTE — Progress Notes (Signed)
°   11/06/21 2025  Psych Admission Type (Psych Patients Only)  Admission Status Voluntary  Psychosocial Assessment  Patient Complaints None  Eye Contact Fair  Facial Expression Flat  Affect Appropriate to circumstance  Speech Logical/coherent  Interaction Forwards little;Minimal  Motor Activity Other (Comment) (wnl)  Appearance/Hygiene In hospital gown  Behavior Characteristics Cooperative;Appropriate to situation  Mood Pleasant  Thought Process  Coherency WDL  Content WDL  Delusions None reported or observed  Perception WDL  Hallucination None reported or observed  Judgment Poor  Confusion None  Danger to Self  Current suicidal ideation? Denies  Danger to Others  Danger to Others None reported or observed   Pt seen at med window. Pt denies SI, HI, AVH and pain. Pt denies anxiety and depression. Pt states that she does not want medications until she can research them. Pt says she also wants to find a therapist to speak with to aid in her mental health. Pt encouraged to reach out to SW for assistance. Also suggested pt check with counseling services at her [facility]. Pt said she would investigate this on Monday.  Stated the provider is hopeful to discharge her on Saturday. She begins a new job on Monday.

## 2021-11-06 NOTE — Tx Team (Signed)
Initial Treatment Plan 11/06/2021 3:20 AM [name] [id number]    PATIENT STRESSORS: Educational concerns   Occupational concerns     PATIENT STRENGTHS: Capable of independent living  Communication skills  General fund of knowledge  Motivation for treatment/growth  Physical Health  Supportive family/friends    PATIENT IDENTIFIED PROBLEMS: Depression  Suicide attempt    "Work on coping mechanisms"   "Help with stress relief"             DISCHARGE CRITERIA:  Improved stabilization in mood, thinking, and/or behavior Need for constant or close observation no longer present Reduction of life-threatening or endangering symptoms to within safe limits Verbal commitment to aftercare and medication compliance  PRELIMINARY DISCHARGE PLAN: Outpatient therapy  PATIENT/FAMILY INVOLVEMENT: This treatment plan has been presented to and reviewed with the patient, Renee Stanley.  The patient and family have been given the opportunity to ask questions and make suggestions.  Levin Bacon, RN 11/06/2021, 3:20 AM

## 2021-11-07 ENCOUNTER — Encounter (HOSPITAL_COMMUNITY): Payer: Self-pay

## 2021-11-07 LAB — URINALYSIS, ROUTINE W REFLEX MICROSCOPIC
Bilirubin Urine: NEGATIVE
Glucose, UA: NEGATIVE mg/dL
Ketones, ur: 5 mg/dL — AB
Leukocytes,Ua: NEGATIVE
Nitrite: NEGATIVE
Protein, ur: 30 mg/dL — AB
Specific Gravity, Urine: 1.031 — ABNORMAL HIGH (ref 1.005–1.030)
pH: 5 (ref 5.0–8.0)

## 2021-11-07 LAB — HEMOGLOBIN A1C
Hgb A1c MFr Bld: 5.1 % (ref 4.8–5.6)
Mean Plasma Glucose: 100 mg/dL

## 2021-11-07 NOTE — Progress Notes (Signed)
[facility] MD Progress Note  11/07/2021 6:15 PM Renee Stanley  MRN:  [id number]  History of Present Illness: Renee Stanley is a 31 y.o female with a self reported history of depression who was taken to the [facility] after taking an overdose of 40-50 tablets of Trazodone 50 mg pills with an intent to end her life. Pt was medically stabilized and voluntarily transferred to this [facility] for treatment and stabilization of her mood.   24 hour chart reviewed.  Patient discussed in progression rounds. She has been visible and social on the unit. No PRNs  Subjective:  Renee Stanley was seen this afternoon. She feels that her mood is "pretty good" she is reading a book that her father brought to her. She has read the information on lamictal. She has decided that she is not ready yet for medications but will follow up with a therapist. She feels she is sleeping well but her pO intake isn't that great. No hallucinations, thoughts of harm to self or others. We are able to talk about different ways of living and how different cultural perspective can change the way we think about ourselves and our sense of self. She is trying to explore different ways of thinking positively about herself.   Principal Problem: MDD (major depressive disorder), recurrent episode, severe (HCC) Diagnosis: Principal Problem:   MDD (major depressive disorder), recurrent episode, severe (HCC)  Total Time spent with patient: 20 minutes  Past Psychiatric History: MDD, has taken Celexa  Past Medical History:  Past Medical History:  Diagnosis Date   Obesity    Vaginitis     Past Surgical History:  Procedure Laterality Date   GASTRIC BYPASS N/A 01/13/2021   NO PAST SURGERIES     Family History:  Family History  Problem Relation Age of Onset   Breast cancer Mother        age of dx unsure   Diabetes Other        fathers side of the family   Colon cancer Neg Hx    Family Psychiatric  History: N/A Social History:  Social History   Substance  and Sexual Activity  Alcohol Use Yes   Comment: rare     Social History   Substance and Sexual Activity  Drug Use Yes   Types: Marijuana    Social History   Socioeconomic History   Marital status: Single    Spouse name: Not on file   Number of children: 0   Years of education: Not on file   Highest education level: Not on file  Occupational History   Occupation: student  Tobacco Use   Smoking status: Never   Smokeless tobacco: Never  Vaping Use   Vaping Use: Never used  Substance and Sexual Activity   Alcohol use: Yes    Comment: rare   Drug use: Yes    Types: Marijuana   Sexual activity: Not Currently  Other Topics Concern   Not on file  Social History Narrative   Not on file   Social Determinants of Health   Financial Resource Strain: Not on file  Food Insecurity: Not on file  Transportation Needs: Not on file  Physical Activity: Not on file  Stress: Not on file  Social Connections: Not on file   Additional Social History:                         Sleep: Good  Appetite:  Poor  Current Medications: Current Facility-Administered  Medications  Medication Dose Route Frequency Provider Last Rate Last Admin   acetaminophen (TYLENOL) tablet 650 mg  650 mg Oral Q6H PRN Jackelyn Poling, NP       alum & mag hydroxide-simeth (MAALOX/MYLANTA) 200-200-20 MG/5ML suspension 30 mL  30 mL Oral Q6H PRN Nkwenti, Doris, NP       EPINEPHrine (EPI-PEN) injection 0.3 mg  0.3 mg Intramuscular Daily PRN Starleen Blue, NP       hydrOXYzine (ATARAX) tablet 25 mg  25 mg Oral TID PRN Jackelyn Poling, NP       OLANZapine zydis (ZYPREXA) disintegrating tablet 5 mg  5 mg Oral Q8H PRN Jackelyn Poling, NP       And   LORazepam (ATIVAN) tablet 1 mg  1 mg Oral PRN Jackelyn Poling, NP       And   ziprasidone (GEODON) injection 20 mg  20 mg Intramuscular PRN Nira Conn A, NP       magnesium hydroxide (MILK OF MAGNESIA) suspension 30 mL  30 mL Oral Daily PRN Starleen Blue, NP        melatonin tablet 6 mg  6 mg Oral QHS Starleen Blue, NP        Lab Results:  Results for orders placed or performed during the hospital encounter of 11/06/21 (from the past 48 hour(s))  Hemoglobin A1c     Status: None   Collection Time: 11/06/21  6:21 AM  Result Value Ref Range   Hgb A1c MFr Bld 5.1 4.8 - 5.6 %    Comment: (NOTE)         Prediabetes: 5.7 - 6.4         Diabetes: >6.4         Glycemic control for adults with diabetes: <7.0    Mean Plasma Glucose 100 mg/dL    Comment: (NOTE) Performed At: [facility] [address] [place], [place] [id number] Jolene Schimke MD [id number]   Lipid panel     Status: Abnormal   Collection Time: 11/06/21  6:21 AM  Result Value Ref Range   Cholesterol 182 0 - 200 mg/dL   Triglycerides 65 <591 mg/dL   HDL 34 (L) >63 mg/dL   Total CHOL/HDL Ratio 5.4 RATIO   VLDL 13 0 - 40 mg/dL   LDL Cholesterol 846 (H) 0 - 99 mg/dL    Comment:        Total Cholesterol/HDL:CHD Risk Coronary Heart Disease Risk Table                     Men   Women  1/2 Average Risk   3.4   3.3  Average Risk       5.0   4.4  2 X Average Risk   9.6   7.1  3 X Average Risk  23.4   11.0        Use the calculated Patient Ratio above and the CHD Risk Table to determine the patient's CHD Risk.        ATP III CLASSIFICATION (LDL):  <100     mg/dL   Optimal  659-935  mg/dL   Near or Above                    Optimal  130-159  mg/dL   Borderline  701-779  mg/dL   High  >390     mg/dL   Very High Performed at [facility], [address]  [place], [place] [address]   TSH     Status: None   Collection Time: 11/06/21  6:21 AM  Result Value Ref Range   TSH 2.859 0.350 - 4.500 uIU/mL    Comment: Performed by a 3rd Generation assay with a functional sensitivity of <=0.01 uIU/mL. Performed at [facility], [address] [place], [place] [address]   Urinalysis, Routine w reflex microscopic     Status: Abnormal    Collection Time: 11/07/21  5:07 AM  Result Value Ref Range   Color, Urine YELLOW YELLOW   APPearance TURBID (A) CLEAR   Specific Gravity, Urine 1.031 (H) 1.005 - 1.030   pH 5.0 5.0 - 8.0   Glucose, UA NEGATIVE NEGATIVE mg/dL   Hgb urine dipstick MODERATE (A) NEGATIVE   Bilirubin Urine NEGATIVE NEGATIVE   Ketones, ur 5 (A) NEGATIVE mg/dL   Protein, ur 30 (A) NEGATIVE mg/dL   Nitrite NEGATIVE NEGATIVE   Leukocytes,Ua NEGATIVE NEGATIVE   RBC / HPF 21-50 0 - 5 RBC/hpf   WBC, UA 0-5 0 - 5 WBC/hpf   Bacteria, UA MANY (A) NONE SEEN    Comment: Performed at [facility], [address], [place], [place] [address]    Blood Alcohol level:  Lab Results  Component Value Date   ETH <10 11/04/2021    Metabolic Disorder Labs: Lab Results  Component Value Date   HGBA1C 5.1 11/06/2021   MPG 100 11/06/2021   No results found for: PROLACTIN Lab Results  Component Value Date   CHOL 182 11/06/2021   TRIG 65 11/06/2021   HDL 34 (L) 11/06/2021   CHOLHDL 5.4 11/06/2021   VLDL 13 11/06/2021   LDLCALC 135 (H) 11/06/2021    Physical Findings: AIMS: Facial and Oral Movements Muscles of Facial Expression: None, normal Lips and Perioral Area: None, normal Jaw: None, normal Tongue: None, normal,Extremity Movements Upper (arms, wrists, hands, fingers): None, normal Lower (legs, knees, ankles, toes): None, normal, Trunk Movements Neck, shoulders, hips: None, normal, Overall Severity Severity of abnormal movements (highest score from questions above): None, normal Incapacitation due to abnormal movements: None, normal Patient's awareness of abnormal movements (rate only patient's report): No Awareness, Dental Status Current problems with teeth and/or dentures?: No Does patient usually wear dentures?: No  CIWA:    COWS:     Musculoskeletal: Strength & Muscle Tone: within normal limits Gait & Station: normal Patient leans: N/A  Psychiatric Specialty  Exam:  Presentation  General Appearance: Appropriate for Environment; Casual  Eye Contact:Good  Speech:Normal Rate  Speech Volume:Normal  Handedness:Right   Mood and Affect  Mood:Anxious  Affect:Constricted   Thought Process  Thought Processes:Linear  Descriptions of Associations:Intact  Orientation:Full (Time, Place and Person)  Thought Content:Logical  History of Schizophrenia/Schizoaffective disorder:No  Duration of Psychotic Symptoms:No data recorded Hallucinations:Hallucinations: None  Ideas of Reference:None  Suicidal Thoughts:Suicidal Thoughts: No  Homicidal Thoughts:Homicidal Thoughts: No   Sensorium  Memory:Immediate Fair; Recent Fair; Remote Fair  Judgment:Fair  Insight:Shallow   Executive Functions  Concentration:Fair  Attention Span:Fair; Good  Recall:Good  Fund of Knowledge:Good  Language:Good   Psychomotor Activity  Psychomotor Activity:Psychomotor Activity: Normal   Assets  Assets:Communication Skills; Vocational/Educational; Social Support   Sleep  Sleep:Sleep: Fair    Physical Exam: Physical Exam Vitals and nursing note reviewed.  HENT:     Head: Normocephalic.  Eyes:     Extraocular Movements: Extraocular movements intact.  Pulmonary:     Effort: Pulmonary effort is normal.  Musculoskeletal:  General: Normal range of motion.     Cervical back: Normal range of motion.  Neurological:     General: No focal deficit present.     Mental Status: She is alert and oriented to person, place, and time.   Review of Systems  Constitutional:  Negative for chills and fever.  HENT:  Negative for hearing loss.   Eyes:  Negative for blurred vision.  Respiratory:  Negative for cough.   Cardiovascular:  Negative for chest pain.  Gastrointestinal:  Negative for nausea and vomiting.  Musculoskeletal:  Negative for myalgias.  Skin:  Negative for rash.  Neurological:  Negative for dizziness and headaches.   Psychiatric/Behavioral:  Negative for hallucinations and suicidal ideas.   Blood pressure (!) 140/95, pulse 70, temperature (!) 97.5 F (36.4 C), temperature source Oral, resp. rate 19, height 5\' 7"  (1.702 m), weight 105.2 kg, SpO2 100 %. Body mass index is 36.34 kg/m.   Treatment Plan Summary: Daily contact with patient to assess and evaluate symptoms and progress in treatment and Medication management  Medications: Mood/anxiety: continue group therapy, milieu therapy, 1:1 evaluation with provider.  Patient information from UTDOL on lamictal provided, patient declines.  Melatonin 6mg  PO QHS for sleep Hydroxyzine 25mg  PO TID PRN anxiety Follow up with therapy Medical: PRNs for pain, constipation, indigestion available.   Discharge to home with family tomorrow  Roselle LocusStephanie Leigh Narissa Beaufort, MD 11/07/2021, 6:15 PM

## 2021-11-07 NOTE — BH IP Treatment Plan (Signed)
Interdisciplinary Treatment and Diagnostic Plan Update  11/07/2021 Cadience Danh MRN: [id number]  Principal Diagnosis: MDD (major depressive disorder), recurrent episode, severe ([place])  Secondary Diagnoses: Principal Problem:   MDD (major depressive disorder), recurrent episode, severe ([place])   Current Medications:  Current Facility-Administered Medications  Medication Dose Route Frequency Provider Last Rate Last Admin   acetaminophen (TYLENOL) tablet 650 mg  650 mg Oral Q6H PRN Rozetta Nunnery, NP       alum & mag hydroxide-simeth (MAALOX/MYLANTA) 200-200-20 MG/5ML suspension 30 mL  30 mL Oral Q6H PRN Nkwenti, Doris, NP       EPINEPHrine (EPI-PEN) injection 0.3 mg  0.3 mg Intramuscular Daily PRN Nicholes Rough, NP       hydrOXYzine (ATARAX) tablet 25 mg  25 mg Oral TID PRN Rozetta Nunnery, NP       OLANZapine zydis (ZYPREXA) disintegrating tablet 5 mg  5 mg Oral Q8H PRN Rozetta Nunnery, NP       And   LORazepam (ATIVAN) tablet 1 mg  1 mg Oral PRN Rozetta Nunnery, NP       And   ziprasidone (GEODON) injection 20 mg  20 mg Intramuscular PRN Lindon Romp A, NP       magnesium hydroxide (MILK OF MAGNESIA) suspension 30 mL  30 mL Oral Daily PRN Nkwenti, Doris, NP       melatonin tablet 6 mg  6 mg Oral QHS Nkwenti, Doris, NP       PTA Medications: Medications Prior to Admission  Medication Sig Dispense Refill Last Dose   ibuprofen (ADVIL,MOTRIN) 600 MG tablet Take 1 tablet (600 mg total) by mouth every 6 (six) hours as needed. (Patient not taking: Reported on 11/23/2018) 30 tablet 0    naproxen (NAPROSYN) 500 MG tablet Take 1 tablet (500 mg total) by mouth 2 (two) times daily. (Patient not taking: Reported on 11/23/2018) 30 tablet 0    oseltamivir (TAMIFLU) 75 MG capsule Take 1 capsule (75 mg total) by mouth every 12 (twelve) hours. (Patient not taking: Reported on 11/04/2021) 10 capsule 0    traZODone (DESYREL) 50 MG tablet Take 50 mg by mouth at bedtime.       Patient Stressors: Educational  concerns   Occupational concerns    Patient Strengths: Capable of independent living  Marketing executive fund of knowledge  Motivation for treatment/growth  Physical Health  Supportive family/friends   Treatment Modalities: Medication Management, Group therapy, Case management,  1 to 1 session with clinician, Psychoeducation, Recreational therapy.   Physician Treatment Plan for Primary Diagnosis: MDD (major depressive disorder), recurrent episode, severe ([place]) Long Term Goal(s): Improvement in symptoms so as ready for discharge   Short Term Goals: Ability to identify changes in lifestyle to reduce recurrence of condition will improve Ability to verbalize feelings will improve Ability to disclose and discuss suicidal ideas Ability to demonstrate self-control will improve Ability to identify and develop effective coping behaviors will improve Ability to identify triggers associated with substance abuse/mental health issues will improve  Medication Management: Evaluate patient's response, side effects, and tolerance of medication regimen.  Therapeutic Interventions: 1 to 1 sessions, Unit Group sessions and Medication administration.  Evaluation of Outcomes: Progressing  Physician Treatment Plan for Secondary Diagnosis: Principal Problem:   MDD (major depressive disorder), recurrent episode, severe ([place])  Long Term Goal(s): Improvement in symptoms so as ready for discharge   Short Term Goals: Ability to identify changes in lifestyle to reduce recurrence of condition will improve Ability  to verbalize feelings will improve Ability to disclose and discuss suicidal ideas Ability to demonstrate self-control will improve Ability to identify and develop effective coping behaviors will improve Ability to identify triggers associated with substance abuse/mental health issues will improve     Medication Management: Evaluate patient's response, side effects, and tolerance of  medication regimen.  Therapeutic Interventions: 1 to 1 sessions, Unit Group sessions and Medication administration.  Evaluation of Outcomes: Progressing   RN Treatment Plan for Primary Diagnosis: MDD (major depressive disorder), recurrent episode, severe ([place]) Long Term Goal(s): Knowledge of disease and therapeutic regimen to maintain health will improve  Short Term Goals: Ability to verbalize feelings will improve, Ability to disclose and discuss suicidal ideas, and Ability to identify and develop effective coping behaviors will improve  Medication Management: RN will administer medications as ordered by provider, will assess and evaluate patient's response and provide education to patient for prescribed medication. RN will report any adverse and/or side effects to prescribing provider.  Therapeutic Interventions: 1 on 1 counseling sessions, Psychoeducation, Medication administration, Evaluate responses to treatment, Monitor vital signs and CBGs as ordered, Perform/monitor CIWA, COWS, AIMS and Fall Risk screenings as ordered, Perform wound care treatments as ordered.  Evaluation of Outcomes: Progressing   LCSW Treatment Plan for Primary Diagnosis: MDD (major depressive disorder), recurrent episode, severe ([place]) Long Term Goal(s): Safe transition to appropriate next level of care at discharge, Engage patient in therapeutic group addressing interpersonal concerns.  Short Term Goals: Engage patient in aftercare planning with referrals and resources, Facilitate acceptance of mental health diagnosis and concerns, and Identify triggers associated with mental health/substance abuse issues  Therapeutic Interventions: Assess for all discharge needs, 1 to 1 time with Social worker, Explore available resources and support systems, Assess for adequacy in community support network, Educate family and significant other(s) on suicide prevention, Complete Psychosocial Assessment, Interpersonal group  therapy.  Evaluation of Outcomes: Progressing   Progress in Treatment: Attending groups: Yes. Participating in groups: Yes. Taking medication as prescribed: Yes. Toleration medication: Yes. Family/Significant other contact made: Yes, individual(s) contacted:  step-father Patient understands diagnosis: Yes. Discussing patient identified problems/goals with staff: Yes. Medical problems stabilized or resolved: Yes. Denies suicidal/homicidal ideation: No. Issues/concerns per patient self-inventory: No. Other: None  New problem(s) identified: No, Describe:  None  New Short Term/Long Term Goal(s):medication stabilization, elimination of SI thoughts, development of comprehensive mental wellness plan.   Patient Goals:  "stress relievers and outpatient follow up"  Discharge Plan or Barriers: Patient recently admitted. CSW will continue to follow and assess for appropriate referrals and possible discharge planning.   Reason for Continuation of Hospitalization: Depression Medication stabilization Suicidal ideation  Estimated Length of Stay: 3-5 days   Scribe for Treatment Team: Eliott Nine 11/07/2021 3:01 PM

## 2021-11-07 NOTE — Progress Notes (Signed)
Pt denies SI/HI/AVH and verbally agrees to approach staff if these become apparent or before harming themselves/others. Rates depression 0/10. Rates anxiety 0/10. Rates pain 0/10.  Scheduled medications administered to pt, per MD orders. RN provided support and encouragement to pt. Q15 min safety checks implemented and continued. Pt safe on the unit. RN will continue to monitor and intervene as needed.   11/07/21 0810  Psych Admission Type (Psych Patients Only)  Admission Status Voluntary  Psychosocial Assessment  Patient Complaints None  Eye Contact Fair  Facial Expression Animated  Affect Appropriate to circumstance  Speech Logical/coherent  Interaction Assertive  Motor Activity Other (Comment) (WDL)  Appearance/Hygiene Unremarkable  Behavior Characteristics Cooperative;Appropriate to situation;Calm  Mood Pleasant  Thought Process  Coherency WDL  Content WDL  Delusions None reported or observed  Perception WDL  Hallucination None reported or observed  Judgment Impaired  Confusion None  Danger to Self  Current suicidal ideation? Denies  Danger to Others  Danger to Others None reported or observed

## 2021-11-07 NOTE — Progress Notes (Signed)
Patient attend AA wrap up graoup.

## 2021-11-07 NOTE — Group Note (Signed)
Recreation Therapy Group Note   Group Topic:Problem Solving  Group Date: 11/07/2021 Start Time: 0930 End Time: 3299 Facilitators: Caroll Rancher, LRT,CTRS Location: [address] Dayroom   Goal Area(s) Addresses:  Patient will effectively work with peer towards shared goal.  Patient will identify skills used to make activity successful.  Patient will identify how skills used during activity can be applied to reach post d/c goals.   Group Description: [employer]. In teams of 5-6, patients were given 25 small craft pipe cleaners. Using the materials provided, patients were instructed to compete again the opposing team(s) to build the tallest free-standing structure from floor level. The activity was timed; difficulty increased by Clinical research associate as Production designer, theatre/television/film continued.  Systematically resources were removed with additional directions for example, placing one arm behind their back, working in silence, and shape stipulations.   Affect/Mood: Appropriate   Participation Level: Engaged   Participation Quality: Independent   Behavior: Appropriate   Speech/Thought Process: Focused   Insight: Good   Judgement: Good   Modes of Intervention: Competitive Play   Patient Response to Interventions:  Engaged   Education Outcome:  Acknowledges education and In group clarification offered    Clinical Observations/Individualized Feedback: Pt was bright and seemed to have good time working with peers.  Pt was pleasant and engaged throughout group session.    Plan: Continue to engage patient in RT group sessions 2-3x/week.   Caroll Rancher, Antonietta Jewel 11/07/2021 12:32 PM

## 2021-11-07 NOTE — BHH Counselor (Signed)
Pt attended goals groups and stated his goal is to be better today than he was yesterday

## 2021-11-07 NOTE — [facility] Suicide Risk Assessment (Signed)
[facility] INPATIENT:  Family/Significant Other Suicide Prevention Education  Suicide Prevention Education:  Education Completed; Berniece Pap (step-father) [phone] ,  (name of family member/significant other) has been identified by the patient as the family member/significant other with whom the patient will be residing, and identified as the person(s) who will aid the patient in the event of a mental health crisis (suicidal ideations/suicide attempt).  With written consent from the patient, the family member/significant other has been provided the following suicide prevention education, prior to the and/or following the discharge of the patient.  The suicide prevention education provided includes the following: Suicide risk factors Suicide prevention and interventions National Suicide Hotline telephone number [facility] assessment telephone number [facility] Emergency Assistance 911 [facility] and/or Residential Mobile Crisis Unit telephone number  Request made of family/significant other to: Remove weapons (e.g., guns, rifles, knives), all items previously/currently identified as safety concern.   Remove drugs/medications (over-the-counter, prescriptions, illicit drugs), all items previously/currently identified as a safety concern.  The family member/significant other verbalizes understanding of the suicide prevention education information provided.  The family member/significant other agrees to remove the items of safety concern listed above.  Mr.Flate confirms that he secured pt's firearm. No safety concerns.  Felizardo Hoffmann 11/07/2021, 10:36 AM

## 2021-11-07 NOTE — [facility] Group Notes (Signed)
Adult Psychoeducational Group Note  Date:  11/07/2021 Time:  2:54 PM  Group Topic/Focus:  Wellness Toolbox:   The focus of this group is to discuss various aspects of wellness, balancing those aspects and exploring ways to increase the ability to experience wellness.  Patients will create a wellness toolbox for use upon discharge.  Participation Level:  Active  Participation Quality:  Attentive  Affect:  Appropriate  Cognitive:  Alert  Insight: Appropriate  Engagement in Group:  Engaged  Modes of Intervention:  Activity  Additional Comments:  Patient attended and participated in the relaxation group activity.  Jearl Klinefelter 11/07/2021, 2:54 PM

## 2021-11-07 NOTE — Group Note (Signed)
LCSW Group Therapy Note     Group Date: 11/07/2021 Start Time: 1300 End Time: 1400   Therapy Type: Group Therapy   Participation Level:  Active   Description of Group: Patients received a worksheet with an outline of 2 faces. One side is designated for what the pt sees about themselves and the other is what others see. Pt's were asked to introduce themselves and share something they like about themself. Pts were then asked to draw, write or color how they view themselves as well as how they are viewed by others. CSW led discussion about the feelings and words associated with each side.    Patient Summary: Pt attended group and remained there the entire time.  The Pt accepted the 2 worksheets and participated by filling in both worksheets.  The Pt participated in the group discussion and demonstrated understanding of the group topic by sharing their thoughts and feelings about who they are and how that compares and contrasts with how they believe other people view them.     Jayelyn Barno, LCSW, [place] Social Worker  [facility]

## 2021-11-08 DIAGNOSIS — F332 Major depressive disorder, recurrent severe without psychotic features: Principal | ICD-10-CM

## 2021-11-08 LAB — HEMOGLOBIN A1C
Hgb A1c MFr Bld: 5.1 % (ref 4.8–5.6)
Mean Plasma Glucose: 100 mg/dL

## 2021-11-08 MED ORDER — MELATONIN 3 MG PO TABS
6.0000 mg | ORAL_TABLET | Freq: Every day | ORAL | 0 refills | Status: AC
Start: 2021-11-08 — End: ?

## 2021-11-08 NOTE — [facility] Suicide Risk Assessment (Signed)
[facility] Discharge Suicide Risk Assessment   Principal Problem: MDD (major depressive disorder), recurrent episode, severe (HCC) Discharge Diagnoses: Principal Problem:   MDD (major depressive disorder), recurrent episode, severe (HCC)   Total Time spent with patient: 20 minutes  Musculoskeletal: Strength & Muscle Tone: within normal limits Gait & Station: normal Patient leans: N/A  Psychiatric Specialty Exam  Presentation  General Appearance: Appropriate for Environment; Casual  Eye Contact:Good  Speech:Normal Rate  Speech Volume:Normal  Handedness:Right   Mood and Affect  Mood:Anxious  Duration of Depression Symptoms: Greater than two weeks  Affect:Constricted   Thought Process  Thought Processes:Linear  Descriptions of Associations:Intact  Orientation:Full (Time, Place and Person)  Thought Content:Logical  History of Schizophrenia/Schizoaffective disorder:No  Duration of Psychotic Symptoms:No data recorded Hallucinations:Hallucinations: None  Ideas of Reference:None  Suicidal Thoughts:Suicidal Thoughts: No  Homicidal Thoughts:Homicidal Thoughts: No   Sensorium  Memory:Immediate Fair; Recent Fair; Remote Fair  Judgment:Fair  Insight:Shallow   Executive Functions  Concentration:Fair  Attention Span:Fair; Good  Recall:Good  Fund of Knowledge:Good  Language:Good   Psychomotor Activity  Psychomotor Activity:Psychomotor Activity: Normal   Assets  Assets:Communication Skills; Vocational/Educational; Social Support   Sleep  Sleep:Sleep: Fair   Physical Exam: Physical Exam Vitals and nursing note reviewed.  Constitutional:      Appearance: Normal appearance.  HENT:     Head: Normocephalic.  Eyes:     Extraocular Movements: Extraocular movements intact.  Cardiovascular:     Rate and Rhythm: Normal rate.  Pulmonary:     Effort: Pulmonary effort is normal.  Musculoskeletal:        General: Normal range of motion.     Cervical  back: Normal range of motion.  Neurological:     General: No focal deficit present.     Mental Status: She is alert and oriented to person, place, and time.   Review of Systems  Constitutional:  Negative for fever.  Respiratory:  Negative for cough.   Cardiovascular:  Negative for chest pain.  Gastrointestinal:  Negative for constipation and diarrhea.  Genitourinary:  Negative for dysuria.  Musculoskeletal:  Negative for myalgias.  Skin:  Negative for rash.  Neurological:  Negative for dizziness and headaches.  Psychiatric/Behavioral:  Negative for depression, hallucinations and suicidal ideas. The patient does not have insomnia.   Blood pressure 122/86, pulse 85, temperature 98.5 F (36.9 C), temperature source Oral, resp. rate 19, height 5\' 7"  (1.702 m), weight 105.2 kg, SpO2 100 %. Body mass index is 36.34 kg/m.  Mental Status Per Nursing Assessment::   On Admission:  NA  Demographic Factors:  NA  Loss Factors: Financial problems/change in socioeconomic status  Historical Factors: Impulsivity  Risk Reduction Factors:   Sense of responsibility to family, Employed, Positive social support, and Positive coping skills or problem solving skills  Continued Clinical Symptoms:  Medical Diagnoses and Treatments/Surgeries  Cognitive Features That Contribute To Risk:  None    Suicide Risk:  Minimal: No identifiable suicidal ideation.  Patients presenting with no risk factors but with morbid ruminations; may be classified as minimal risk based on the severity of the depressive symptoms   Follow-up Information     [facility], [facility]. Call.   Why: Once you have established care with a [facility] primary care provider, you may call to schedule an appointment for therapy services. Contact information: [address] [place] [phone]         [facility] Psychiatry. Call.   Why: Once you have established care with a  [facility] primary care provider,  you may call to schedule an appointment for medication management services Contact information: [address] [place], [place] [address]  P:  [phone]        [facility] Follow up.   Specialty: Behavioral Health Why: According to your insurance provider's website, this facility accepts your insurance. You have an appointment for therapy on 11/12/2021 at 4pm with Amy. Please bring your insurance card to this appointment. Contact information: [address] [place] [address] [phone]                 Plan Of Care/Follow-up recommendations:  Activity:  ad lib Diet:  bariatric Other:    Prescriptions for new medications provided for the patient to bridge to follow up appointment. The patient was informed that refills for these prescriptions are generally not provided, and patient is encouraged to attend all follow up appointments to address medication refills and adjustments.   Today's discharge was reviewed with treatment team, and the team is in agreement that the patient is ready for discharge. The patient is was of the discharge plan for today and has been given opportunity to ask questions. At time of discharge, the patient does not vocalize any acute harm to self or others, is goal directed, able to advocate for self and organizational baseline.   At discharge, the patient is instructed to:  Take all medications as prescribed. Report any adverse effects and or reactions from the medicines to her outpatient provider promptly.  Do not engage in alcohol and/or illegal drug use while on prescription medicines.  In the event of worsening symptoms, patient is instructed to call the crisis hotline, 911 and or go to the nearest ED for appropriate evaluation and treatment of symptoms.  Follow-up with primary care provider for further care of medical issues, concerns and or health care needs. Medication information was provided on Lamictal, however, this  medication was not prescribed (declined by patient). If this, or other medication, is something that you would like to explore, please contact your psychiatrist or primary care.   Roselle Locus, MD 11/08/2021, 10:09 AM

## 2021-11-08 NOTE — Discharge Summary (Signed)
Physician Discharge Summary Note  Patient:  Renee Stanley is an 31 y.o., female MRN:  [id number] DOB:  05-20-1991 Patient phone:  [phone] (home)  Patient address:   [place] [address],  Total Time spent with patient: 20 minutes  Date of Admission:  11/06/2021 Date of Discharge: 11/08/21  Reason for Admission:   History of Present Illness: Renee Stanley is a 31 y.o female with a self reported history of depression who was taken to the [facility] after taking an overdose of 40-50 tablets of Trazodone 50 mg pills with an intent to end her life. Pt was medically stabilized and voluntarily transferred to this [facility] for treatment and stabilization of her mood.  Principal Problem: MDD (major depressive disorder), recurrent episode, severe ([place]) Discharge Diagnoses: Principal Problem:   MDD (major depressive disorder), recurrent episode, severe ([place])   Past Psychiatric History: history of depression, previously on trazodone as needed for sleep. Has been given celexa in the past but did not like how it made her feel.   Past Medical History:  Past Medical History:  Diagnosis Date   Obesity    Vaginitis     Past Surgical History:  Procedure Laterality Date   GASTRIC BYPASS N/A 01/13/2021   NO PAST SURGERIES     Family History: N/A  Family History  Problem Relation Age of Onset   Breast cancer Mother        age of dx unsure   Diabetes Other        fathers side of the family   Colon cancer Neg Hx    Family Psychiatric  History:Social History:  Social History   Substance and Sexual Activity  Alcohol Use Yes   Comment: rare     Social History   Substance and Sexual Activity  Drug Use Yes   Types: Marijuana    Social History   Socioeconomic History   Marital status: Single    Spouse name: Not on file   Number of children: 0   Years of education: Not on file   Highest education level: Not on file  Occupational History   Occupation: student   Tobacco Use   Smoking status: Never   Smokeless tobacco: Never  Vaping Use   Vaping Use: Never used  Substance and Sexual Activity   Alcohol use: Yes    Comment: rare   Drug use: Yes    Types: Marijuana   Sexual activity: Not Currently  Other Topics Concern   Not on file  Social History Narrative   Not on file   Social Determinants of Health   Financial Resource Strain: Not on file  Food Insecurity: Not on file  Transportation Needs: Not on file  Physical Activity: Not on file  Stress: Not on file  Social Connections: Not on file    Hospital Course:   During the course of patient's hospitalization, the 15-minute checks were adequate to ensure patient's safety. Patient did not exhibit erratic or aggressive behavior. She was offered medication management but she declined. Education on medication was provided, including printout on lamictal from [employer] for her to keep and consider at a later date. Patient was also encouraged to consider other modalities like [place], and printout on this also provided. Patient was recommended for outpatient psychotherapy.  At the time of discharge patient is not reporting any acute suicidal/homicidal ideations/AVH, delusional thoughts or paranoia. Patient did not appear to be responding to any internal stimuli. Patient feels more confident about self-care &  in managing their mental health problems. Patient currently denies any new issues or concerns. Education and supportive counseling provided throughout patient's hospital stay & upon discharge.   Today upon discharge evaluation, the patient gives a mood of "pretty good". Patient denies any specific concerns. Patient slept well, appetite good, regular bowel movements. Patient denies any new physical complaints. Patient feels that the medications have been helpful & is in agreement to continue current treatment regimen as recommended. Patient was able to engage in safety planning including plan to  return to [facility] or contact emergency services if patient feels unable to maintain their own safety or the safety of others. Patient had no further questions, comments, or concerns. Patient left [facility] with all personal belongings in no apparent distress. Transportation per safe transport to home was arranged for patient.   Physical Findings: AIMS: Facial and Oral Movements Muscles of Facial Expression: None, normal Lips and Perioral Area: None, normal Jaw: None, normal Tongue: None, normal,Extremity Movements Upper (arms, wrists, hands, fingers): None, normal Lower (legs, knees, ankles, toes): None, normal, Trunk Movements Neck, shoulders, hips: None, normal, Overall Severity Severity of abnormal movements (highest score from questions above): None, normal Incapacitation due to abnormal movements: None, normal Patient's awareness of abnormal movements (rate only patient's report): No Awareness, Dental Status Current problems with teeth and/or dentures?: No Does patient usually wear dentures?: No  CIWA:    COWS:     Musculoskeletal: Strength & Muscle Tone: within normal limits Gait & Station: normal Patient leans: N/A   Psychiatric Specialty Exam:  Presentation  General Appearance: Appropriate for Environment; Casual  Eye Contact:Good  Speech:Normal Rate  Speech Volume:Normal  Handedness:Right   Mood and Affect  Mood:Anxious  Affect:Constricted   Thought Process  Thought Processes:Linear  Descriptions of Associations:Intact  Orientation:Full (Time, Place and Person)  Thought Content:Logical  History of Schizophrenia/Schizoaffective disorder:No  Duration of Psychotic Symptoms:No data recorded Hallucinations:Hallucinations: None  Ideas of Reference:None  Suicidal Thoughts:Suicidal Thoughts: No  Homicidal Thoughts:Homicidal Thoughts: No   Sensorium  Memory:Immediate Fair; Recent Fair; Remote Fair  Judgment:Fair  Insight:Shallow   Executive Functions   Concentration:Fair  Attention Span:Fair; Good  Recall:Good  Fund of Knowledge:Good  Language:Good   Psychomotor Activity  Psychomotor Activity:Psychomotor Activity: Normal   Assets  Assets:Communication Skills; Vocational/Educational; Social Support   Sleep  Sleep:Sleep: Fair    Physical Exam: Physical Exam Vitals and nursing note reviewed.  Constitutional:      Appearance: Normal appearance.  HENT:     Head: Normocephalic.  Eyes:     Extraocular Movements: Extraocular movements intact.  Cardiovascular:     Rate and Rhythm: Normal rate.  Pulmonary:     Effort: Pulmonary effort is normal.  Musculoskeletal:        General: Normal range of motion.     Cervical back: Normal range of motion.  Neurological:     General: No focal deficit present.     Mental Status: She is alert and oriented to person, place, and time.    Review of Systems  Constitutional:  Negative for fever.  Respiratory:  Negative for cough.   Cardiovascular:  Negative for chest pain.  Gastrointestinal:  Negative for constipation and diarrhea.  Genitourinary:  Negative for dysuria.  Musculoskeletal:  Negative for myalgias.  Skin:  Negative for rash.  Neurological:  Negative for dizziness and headaches.  Psychiatric/Behavioral:  Negative for depression, hallucinations and suicidal ideas. The patient does not have insomnia.   Blood pressure 122/86, pulse 85, temperature 98.5 F (36.9  C), temperature source Oral, resp. rate 19, height 5\' 7"  (1.702 m), weight 105.2 kg, SpO2 100 %. Body mass index is 36.34 kg/m.   Social History   Tobacco Use  Smoking Status Never  Smokeless Tobacco Never   Tobacco Cessation:  N/A, patient does not currently use tobacco products   Blood Alcohol level:  Lab Results  Component Value Date   ETH <10 123456    Metabolic Disorder Labs:  Lab Results  Component Value Date   HGBA1C 5.1 11/07/2021   MPG 100 11/07/2021   MPG 100 11/06/2021   No  results found for: PROLACTIN Lab Results  Component Value Date   CHOL 182 11/06/2021   TRIG 65 11/06/2021   HDL 34 (L) 11/06/2021   CHOLHDL 5.4 11/06/2021   VLDL 13 11/06/2021   LDLCALC 135 (H) 11/06/2021    See Psychiatric Specialty Exam and Suicide Risk Assessment completed by Attending Physician prior to discharge.  Discharge destination:  Home  Is patient on multiple antipsychotic therapies at discharge:  No     Discharge Instructions     Diet - low sodium heart healthy   Complete by: As directed    Diet general   Complete by: As directed    Discharge instructions   Complete by: As directed    Follow up with psychotherapy.   Increase activity slowly   Complete by: As directed       Allergies as of 11/08/2021       Reactions   Mushroom Extract Complex Anaphylaxis   Peanut (diagnostic) Anaphylaxis   Other Hives   Peanuts   Fish-derived Products Rash        Medication List     STOP taking these medications    naproxen 500 MG tablet Commonly known as: NAPROSYN   oseltamivir 75 MG capsule Commonly known as: TAMIFLU       TAKE these medications      Indication  ibuprofen 600 MG tablet Commonly known as: ADVIL Take 1 tablet (600 mg total) by mouth every 6 (six) hours as needed.  Indication: Pain   melatonin 3 MG Tabs tablet Take 2 tablets (6 mg total) by mouth at bedtime.  Indication: Trouble Sleeping   traZODone 50 MG tablet Commonly known as: DESYREL Take 50 mg by mouth at bedtime.  Indication: Trouble Sleeping        Follow-up Information     Counseling, [facility]. Call.   Why: Once you have established care with a [facility] primary care provider, you may call to schedule an appointment for therapy services. Contact information: [place] [address] [phone]         [facility] Psychiatry. Call.   Why: Once you have established care with a [facility] primary care provider, you may call to schedule an  appointment for medication management services Contact information: [place]. [place], [place] [address]  P:  [place] Follow up.   Specialty: Behavioral Health Why: According to your insurance provider's website, this facility accepts your insurance. You have an appointment for therapy on 11/12/2021 at 4pm with Amy. Please bring your insurance card to this appointment. Contact information: [address] [place] [address] [phone]                 Follow-up recommendations:  Activity:  ad lib Diet:  regular  Comments:    Prescriptions for new medications provided for the  patient to bridge to follow up appointment. The patient was informed that refills for these prescriptions are generally not provided, and patient is encouraged to attend all follow up appointments to address medication refills and adjustments.   Today's discharge was reviewed with treatment team, and the team is in agreement that the patient is ready for discharge. The patient is was of the discharge plan for today and has been given opportunity to ask questions. At time of discharge, the patient does not vocalize any acute harm to self or others, is goal directed, able to advocate for self and organizational baseline.   At discharge, the patient is instructed to:  Take all medications as prescribed. Report any adverse effects and or reactions from the medicines to her outpatient provider promptly.  Do not engage in alcohol and/or illegal drug use while on prescription medicines.  In the event of worsening symptoms, patient is instructed to call the crisis hotline, 911 and or go to the nearest ED for appropriate evaluation and treatment of symptoms.  Follow-up with primary care provider for further care of medical issues, concerns and or health care needs.   Signed: Maida Sale, MD 11/08/2021, 11:04 AM

## 2021-11-08 NOTE — Progress Notes (Signed)
°  [facility] Adult Case Management Discharge Plan :  Will you be returning to the same living situation after discharge:  Yes,  living at home alone. At discharge, do you have transportation home?: Yes,  step-father to pick up. Do you have the ability to pay for your medications: Yes,  has active coverage.  Release of information consent forms completed and in the chart;  Patient's signature needed at discharge.  Patient to Follow up at:  Follow-up Information     Counsel[facility], [facility]. Call.   Why: Once you have established care with a [facility] primary care provider, you may call to schedule an appointment for therapy services. Contact information: [place] [address] [phone]         [facility] Psychiatry. Call.   Why: Once you have established care with a [facility] primary care provider, you may call to schedule an appointment for medication management services Contact information: [place]. [place], [place] [address]  P:  [place] Follow up.   Specialty: Behavioral Health Why: According to your insurance provider's website, this facility accepts your insurance. You have an appointment for therapy on 11/12/2021 at 4pm with Amy. Please bring your insurance card to this appointment. Contact information: [place]  [address] [phone]                 Next level of care provider has access to [place] and Suicide Prevention discussed: Yes, with step-father.     Has patient been referred to the Quitline?: Patient refused referral  Patient has been referred for addiction treatment: N/A  Baird Kay, LCSWA 11/08/2021, 10:49 AM

## 2021-11-08 NOTE — Plan of Care (Signed)
Nurse discussed anxiety; depression and coping skills with patient.  

## 2021-11-08 NOTE — Progress Notes (Signed)
° ° °   11/07/21 2230  Psych Admission Type (Psych Patients Only)  Admission Status Voluntary  Psychosocial Assessment  Patient Complaints None  Eye Contact Fair  Facial Expression Animated  Affect Appropriate to circumstance  Speech Logical/coherent  Interaction Assertive  Motor Activity Other (Comment) (Steady)  Appearance/Hygiene Unremarkable  Behavior Characteristics Cooperative;Appropriate to situation;Calm  Mood Pleasant  Thought Process  Coherency WDL  Content WDL  Delusions None reported or observed  Perception WDL  Hallucination None reported or observed  Judgment Impaired  Confusion None  Danger to Self  Current suicidal ideation? Denies  Danger to Others  Danger to Others None reported or observed

## 2021-11-08 NOTE — Progress Notes (Signed)
Discharge Note  Patient discharged home, step father to pick her up.  Suicide prevention information given and discussed with patient who stated she understood and had no questions.  Denied SI and HI.  Denied A/V hallucinations.  Patient stated she received all her belongings, clothing, toiletries, misc items.  Patient stated she appreciated all assistance from [facility] staff.  All required discharge information given.

## 2021-11-08 NOTE — Progress Notes (Signed)
D:  Patient denied SI and HI, contracts for safety.  Denied A/V hallucinations.  Denied pain. A:  Medications administered per MD orders.  Emotional support and encouragement given patient. R:  Safety maintained with 15 minute checks.  

## 2021-11-08 NOTE — Plan of Care (Signed)
  Problem: Education: Goal: Emotional status will improve Outcome: Progressing Goal: Mental status will improve Outcome: Progressing Goal: Verbalization of understanding the information provided will improve Outcome: Progressing   Problem: Activity: Goal: Interest or engagement in activities will improve Outcome: Progressing Goal: Sleeping patterns will improve Outcome: Progressing
